# Patient Record
Sex: Female | Born: 1950 | Race: Black or African American | Hispanic: No | Smoking: Current every day smoker
Health system: Southern US, Community
[De-identification: ages and names within clinical notes are randomized; demographics above are authoritative.]

## PROBLEM LIST (undated history)

## (undated) DIAGNOSIS — E079 Disorder of thyroid, unspecified: Secondary | ICD-10-CM

## (undated) DIAGNOSIS — Z5189 Encounter for other specified aftercare: Secondary | ICD-10-CM

## (undated) DIAGNOSIS — C801 Malignant (primary) neoplasm, unspecified: Secondary | ICD-10-CM

## (undated) DIAGNOSIS — R569 Unspecified convulsions: Secondary | ICD-10-CM

## (undated) DIAGNOSIS — N189 Chronic kidney disease, unspecified: Secondary | ICD-10-CM

## (undated) DIAGNOSIS — H409 Unspecified glaucoma: Secondary | ICD-10-CM

## (undated) DIAGNOSIS — J449 Chronic obstructive pulmonary disease, unspecified: Secondary | ICD-10-CM

## (undated) DIAGNOSIS — I10 Essential (primary) hypertension: Secondary | ICD-10-CM

## (undated) DIAGNOSIS — F200 Paranoid schizophrenia: Secondary | ICD-10-CM

## (undated) DIAGNOSIS — F419 Anxiety disorder, unspecified: Secondary | ICD-10-CM

## (undated) DIAGNOSIS — F329 Major depressive disorder, single episode, unspecified: Secondary | ICD-10-CM

## (undated) DIAGNOSIS — I509 Heart failure, unspecified: Secondary | ICD-10-CM

## (undated) DIAGNOSIS — I639 Cerebral infarction, unspecified: Secondary | ICD-10-CM

## (undated) DIAGNOSIS — M1990 Unspecified osteoarthritis, unspecified site: Secondary | ICD-10-CM

## (undated) DIAGNOSIS — J439 Emphysema, unspecified: Secondary | ICD-10-CM

## (undated) DIAGNOSIS — K219 Gastro-esophageal reflux disease without esophagitis: Secondary | ICD-10-CM

## (undated) DIAGNOSIS — D649 Anemia, unspecified: Secondary | ICD-10-CM

## (undated) DIAGNOSIS — F1910 Other psychoactive substance abuse, uncomplicated: Secondary | ICD-10-CM

## (undated) DIAGNOSIS — F32A Depression, unspecified: Secondary | ICD-10-CM

## (undated) HISTORY — DX: Chronic obstructive pulmonary disease, unspecified: J44.9

## (undated) HISTORY — DX: Cerebral infarction, unspecified: I63.9

## (undated) HISTORY — DX: Paranoid schizophrenia: F20.0

## (undated) HISTORY — DX: Anxiety disorder, unspecified: F41.9

## (undated) HISTORY — DX: Encounter for other specified aftercare: Z51.89

## (undated) HISTORY — DX: Chronic kidney disease, unspecified: N18.9

## (undated) HISTORY — DX: Anemia, unspecified: D64.9

## (undated) HISTORY — DX: Heart failure, unspecified: I50.9

## (undated) HISTORY — DX: Gastro-esophageal reflux disease without esophagitis: K21.9

## (undated) HISTORY — DX: Major depressive disorder, single episode, unspecified: F32.9

## (undated) HISTORY — DX: Unspecified glaucoma: H40.9

## (undated) HISTORY — DX: Depression, unspecified: F32.A

## (undated) HISTORY — PX: BLADDER SURGERY: SHX569

## (undated) HISTORY — DX: Other psychoactive substance abuse, uncomplicated: F19.10

## (undated) HISTORY — DX: Emphysema, unspecified: J43.9

## (undated) HISTORY — DX: Unspecified convulsions: R56.9

## (undated) HISTORY — DX: Disorder of thyroid, unspecified: E07.9

## (undated) HISTORY — DX: Unspecified osteoarthritis, unspecified site: M19.90

## (undated) HISTORY — DX: Essential (primary) hypertension: I10

## (undated) HISTORY — DX: Malignant (primary) neoplasm, unspecified: C80.1

## (undated) HISTORY — PX: ABDOMINAL HYSTERECTOMY: SHX81

---

## 2003-07-07 ENCOUNTER — Emergency Department (HOSPITAL_COMMUNITY): Admission: EM | Admit: 2003-07-07 | Discharge: 2003-07-07 | Payer: Self-pay | Admitting: Emergency Medicine

## 2003-07-09 ENCOUNTER — Emergency Department (HOSPITAL_COMMUNITY): Admission: EM | Admit: 2003-07-09 | Discharge: 2003-07-09 | Payer: Self-pay | Admitting: Emergency Medicine

## 2004-08-17 ENCOUNTER — Emergency Department (HOSPITAL_COMMUNITY): Admission: EM | Admit: 2004-08-17 | Discharge: 2004-08-17 | Payer: Self-pay | Admitting: Emergency Medicine

## 2004-10-02 ENCOUNTER — Ambulatory Visit: Payer: Self-pay

## 2005-01-30 ENCOUNTER — Emergency Department: Payer: Self-pay | Admitting: Emergency Medicine

## 2005-06-26 ENCOUNTER — Ambulatory Visit: Payer: Self-pay | Admitting: Otolaryngology

## 2005-08-02 ENCOUNTER — Ambulatory Visit: Payer: Self-pay

## 2005-09-23 ENCOUNTER — Emergency Department: Payer: Self-pay | Admitting: Internal Medicine

## 2006-01-15 ENCOUNTER — Ambulatory Visit: Payer: Self-pay | Admitting: Cardiovascular Disease

## 2006-11-20 ENCOUNTER — Emergency Department (HOSPITAL_COMMUNITY): Admission: EM | Admit: 2006-11-20 | Discharge: 2006-11-20 | Payer: Self-pay | Admitting: Emergency Medicine

## 2006-11-21 ENCOUNTER — Inpatient Hospital Stay (HOSPITAL_COMMUNITY): Admission: EM | Admit: 2006-11-21 | Discharge: 2006-11-22 | Payer: Self-pay | Admitting: Emergency Medicine

## 2006-11-21 ENCOUNTER — Ambulatory Visit: Payer: Self-pay | Admitting: Internal Medicine

## 2007-02-12 ENCOUNTER — Other Ambulatory Visit: Payer: Self-pay

## 2007-02-12 ENCOUNTER — Emergency Department: Payer: Self-pay

## 2007-05-06 ENCOUNTER — Inpatient Hospital Stay: Payer: Self-pay | Admitting: Internal Medicine

## 2007-05-06 ENCOUNTER — Other Ambulatory Visit: Payer: Self-pay

## 2007-05-10 ENCOUNTER — Emergency Department: Payer: Self-pay | Admitting: Emergency Medicine

## 2007-05-11 ENCOUNTER — Other Ambulatory Visit: Payer: Self-pay

## 2007-06-09 ENCOUNTER — Other Ambulatory Visit: Payer: Self-pay

## 2007-06-09 ENCOUNTER — Emergency Department: Payer: Self-pay | Admitting: Emergency Medicine

## 2007-10-08 ENCOUNTER — Other Ambulatory Visit: Payer: Self-pay

## 2007-10-08 ENCOUNTER — Emergency Department: Payer: Self-pay | Admitting: Internal Medicine

## 2008-04-30 ENCOUNTER — Other Ambulatory Visit: Payer: Self-pay

## 2008-04-30 ENCOUNTER — Inpatient Hospital Stay: Payer: Self-pay | Admitting: Psychiatry

## 2009-09-10 ENCOUNTER — Emergency Department: Payer: Self-pay | Admitting: Emergency Medicine

## 2009-10-10 ENCOUNTER — Emergency Department (HOSPITAL_COMMUNITY): Admission: EM | Admit: 2009-10-10 | Discharge: 2009-10-10 | Payer: Self-pay | Admitting: Emergency Medicine

## 2009-10-21 ENCOUNTER — Inpatient Hospital Stay: Payer: Self-pay | Admitting: Psychiatry

## 2009-11-03 ENCOUNTER — Emergency Department: Payer: Self-pay | Admitting: Emergency Medicine

## 2009-11-25 ENCOUNTER — Emergency Department: Payer: Self-pay | Admitting: Unknown Physician Specialty

## 2009-11-25 ENCOUNTER — Emergency Department: Payer: Self-pay | Admitting: Emergency Medicine

## 2009-12-04 ENCOUNTER — Emergency Department: Payer: Self-pay | Admitting: Emergency Medicine

## 2010-04-24 ENCOUNTER — Emergency Department (HOSPITAL_COMMUNITY): Admission: EM | Admit: 2010-04-24 | Discharge: 2010-04-24 | Payer: Self-pay | Admitting: Emergency Medicine

## 2010-09-12 ENCOUNTER — Inpatient Hospital Stay: Payer: Self-pay | Admitting: Internal Medicine

## 2010-11-10 ENCOUNTER — Ambulatory Visit: Payer: Self-pay | Admitting: Rheumatology

## 2010-12-12 ENCOUNTER — Ambulatory Visit: Payer: Self-pay | Admitting: Rheumatology

## 2010-12-20 ENCOUNTER — Ambulatory Visit: Payer: Self-pay | Admitting: Oncology

## 2010-12-22 LAB — CEA: CEA: 3.9 ng/mL

## 2010-12-22 LAB — CANCER ANTIGEN 27.29: CA 27.29: 24.9 U/mL (ref 0.0–38.6)

## 2010-12-22 LAB — CA 125: CA 125: 8.4 U/mL

## 2011-01-10 ENCOUNTER — Ambulatory Visit: Payer: Self-pay | Admitting: Gastroenterology

## 2011-01-10 ENCOUNTER — Ambulatory Visit: Payer: Self-pay | Admitting: Oncology

## 2011-01-12 LAB — PATHOLOGY REPORT

## 2011-02-26 LAB — POCT I-STAT, CHEM 8
BUN: 22 mg/dL (ref 6–23)
Calcium, Ion: 1.23 mmol/L (ref 1.12–1.32)
Chloride: 107 mEq/L (ref 96–112)
Creatinine, Ser: 0.9 mg/dL (ref 0.4–1.2)
Glucose, Bld: 86 mg/dL (ref 70–99)
HCT: 38 % (ref 36.0–46.0)
Sodium: 141 mEq/L (ref 135–145)
TCO2: 29 mmol/L (ref 0–100)

## 2011-02-26 LAB — CBC
HCT: 34.6 % — ABNORMAL LOW (ref 36.0–46.0)
Hemoglobin: 12.2 g/dL (ref 12.0–15.0)
MCHC: 35.2 g/dL (ref 30.0–36.0)
MCV: 87.5 fL (ref 78.0–100.0)
Platelets: 171 10*3/uL (ref 150–400)
RDW: 17.9 % — ABNORMAL HIGH (ref 11.5–15.5)
WBC: 7.7 10*3/uL (ref 4.0–10.5)

## 2011-02-26 LAB — DIFFERENTIAL
Eosinophils Absolute: 0.1 10*3/uL (ref 0.0–0.7)
Eosinophils Relative: 1 % (ref 0–5)
Monocytes Relative: 5 % (ref 3–12)
Neutrophils Relative %: 68 % (ref 43–77)

## 2011-02-26 LAB — RAPID URINE DRUG SCREEN, HOSP PERFORMED
Benzodiazepines: NOT DETECTED
Opiates: NOT DETECTED
Tetrahydrocannabinol: POSITIVE — AB

## 2011-03-14 LAB — DIFFERENTIAL
Basophils Absolute: 0 10*3/uL (ref 0.0–0.1)
Basophils Relative: 1 % (ref 0–1)
Eosinophils Absolute: 0.1 10*3/uL (ref 0.0–0.7)
Lymphocytes Relative: 30 % (ref 12–46)
Lymphs Abs: 1.7 10*3/uL (ref 0.7–4.0)
Monocytes Absolute: 0.4 10*3/uL (ref 0.1–1.0)
Monocytes Relative: 7 % (ref 3–12)
Neutro Abs: 3.3 10*3/uL (ref 1.7–7.7)

## 2011-03-14 LAB — BASIC METABOLIC PANEL
CO2: 25 mEq/L (ref 19–32)
Calcium: 9.5 mg/dL (ref 8.4–10.5)
Chloride: 103 mEq/L (ref 96–112)
Creatinine, Ser: 0.85 mg/dL (ref 0.4–1.2)
GFR calc non Af Amer: >60 mL/min (ref >60)
Potassium: 3.2 mEq/L — ABNORMAL LOW (ref 3.5–5.1)
Sodium: 137 mEq/L (ref 135–145)

## 2011-03-14 LAB — CBC: RBC: 4.44 MIL/uL (ref 3.87–5.11)

## 2011-03-14 LAB — RAPID URINE DRUG SCREEN, HOSP PERFORMED
Benzodiazepines: NOT DETECTED
Opiates: NOT DETECTED
Tetrahydrocannabinol: POSITIVE — AB

## 2011-03-14 LAB — ETHANOL
Alcohol, Ethyl (B): 25 mg/dL — ABNORMAL HIGH (ref 0–10)
Alcohol, Ethyl (B): <5 mg/dL (ref 0–10)

## 2011-04-27 NOTE — Consult Note (Signed)
[name], [name] NO.:  [id number]   MEDICAL RECORD NO.:  [id number]          PATIENT TYPE:  INP   LOCATION:  5504                         FACILITY:  [facility]   PHYSICIAN:  Antonietta Breach, M.D.  DATE OF BIRTH:  [birth year]   DATE OF CONSULTATION:  11/22/2006  DATE OF DISCHARGE:  11/22/2006                                 CONSULTATION   REQUESTING PHYSICIAN:  Madaline Guthrie, M.D.   REASON FOR CONSULTATION:  Depression, alcohol dependence.   HISTORY OF PRESENT ILLNESS:  [name] is a 60 year old female  admitted to the [facility] on November 21, 2006 due to  complications from alcohol.   The patient has been smoking as much marijuana as she can get a hold of  daily.  She also has been drinking regular alcohol.  She has had  secondary cramps.  She has also had diarrhea; however, when she tries to  quit alcohol, she is not able to quit on her own and the patient  acknowledges she drinks to excess and ends up with impaired judgment  when she uses alcohol.   After being in the hospital a day, she has time to reflect on her  impairment when she is intoxicated and she wants to abstain from  alcohol.  She does report some mild depressed mood and mild decreased  energy.  There are no thoughts of harming herself or others.  There are  delusions or hallucinations.  She is socially appropriate and is  oriented.  Her memory is intact.  Her concentration is within normal  limits.   PAST PSYCHIATRIC HISTORY:  The patient denies any history of psychiatric  hospitalization, suicide attempts, or mania.  She was admitted to an  alcohol residential rehabilitation program 15 years ago.   She maintained 2 years of alcohol abstinence after that; however, she  relapsed about 12 years ago and has not been able to quit since.   The patient states that she was placed on Paxil at one point and she had  the onset of high-mood, pressured speech, increased activity, and  was  taken off of it.   FAMILY PSYCHIATRIC HISTORY:  None known.   SOCIAL HISTORY:  [name] is from the area.  She attended 12 years of  special education school.  She did work in Press [employer].  She is divorced.  She has 3 female children.  The patient lives with a boyfriend.  Occupationally, she is medically retired on disability.  Concerning her  chemical abuse, see above.   GENERAL MEDICAL PROBLEMS:  1. Chronic obstructive pulmonary disease.  2. Diabetes mellitus.  3. Diarrhea.  4. Hypertension.   MEDICATIONS:  The MAR is reviewed.  The patient is on:  1. Thiamin 100 mg daily.  2. Folic acid 1 mg daily.   ALLERGIES:  NO KNOWN DRUG ALLERGIES.   LABORATORY DATA:  White blood cell count 6.4, hemoglobin 12.3, platelet  count 161,000.  Metabolic panel is unremarkable.  Her initial SGOT was  elevated at 48, SGPT 44.  Her calcium is 9.  Her TSH is within normal  limits  at 0.9.  Urine drug screen is unremarkable except for a positive  tetrahydrocannabinol consistent with her history.  The alcohol level  upon presentation was 77.   REVIEW OF SYSTEMS:  CONSTITUTIONAL:  Afebrile.  HEAD:  No trauma.  Eyes:  No visual changes.  Ears:  No hearing impairment.  Nose:  No rhinorrhea.  Mouth/Throat:  No sore throat.  NEURO:  Unremarkable.  PSYCHIATRIC:  As  above.  The patient has a normal level of interests, constructive goals,  and the ability to experience pleasure.  CARDIOVASCULAR:  No chest pain,  palpitations, or edema.  RESPIRATORY:  No coughing or wheezing.  GASTROINTESTINAL:  No nausea or vomiting.  GENITOURINARY:  No dysuria.  SKIN:  Unremarkable.  HEMATOLOGIC/LYMPHATIC:  Unremarkable.  MUSCULOSKELETAL:  No deformities.  ENDOCRINE/METABOLIC:  As above.   PHYSICAL EXAMINATION:  VITAL SIGNS:  Temperature 97.6, pulse 70,  respirations 20, blood pressure 198/105, O2 saturation room air 98%.  MENTAL STATUS EXAM:  [name] is a middle-aged female appearing her  stated age,  well-groomed, sitting up in her hospital chair with good eye  contact.  Her psychomotor tone is within normal limits.  She is socially  appropriate and cooperative.  Her affect is mildly constricted at  baseline, but with a broad appropriate response.  Her mood is within  normal limits.   Her speech involves normal rate and prosody.  Her fund of knowledge and  intelligence are within normal limits.  Thought process is logical,  coherent, goal-directed, no looseness of associations.  Thought content:  No thoughts of harming herself, no thoughts of harming others.  No  delusions, no hallucinations.  She is oriented to all spheres.  Her  memory is intact to immediate, recent remote.  Her insight is partial.  Her judgment is intact.   ASSESSMENT:  AXIS I:  293.83 mood disorder not otherwise specified  (general medical and alchohol abuse factors).  Rule out polysubstance  dependence.  AXIS II:  Deferred.  AXIS III:  See general medical problems.  AXIS IV:  Primary support group.  AXIS V:  55.   [name] is not at risk to harm herself or others.  She agrees to  call emergency services immediately for any thoughts of harming herself,  thoughts of harming others, or distress.   The undersigned provided ego-supportive psychotherapy and education.   RECOMMENDATIONS:  1. [name] declines an inpatient drug rehabilitation program.  She      is stating that she is motivated; however, for outpatient      treatment.  She wants followup with 12-step groups.  2. The case manager and the patient have agreed to her entering the      outpatient [facility] program.      Antonietta Breach, M.D.  Electronically Signed     JW/MEDQ  D:  11/22/2006  T:  11/24/2006  Job:  [id number]   cc:   Madaline Guthrie, M.D.

## 2011-05-26 ENCOUNTER — Inpatient Hospital Stay: Payer: Self-pay | Admitting: Psychiatry

## 2011-12-10 ENCOUNTER — Inpatient Hospital Stay: Payer: Self-pay | Admitting: Psychiatry

## 2011-12-14 LAB — URINALYSIS, COMPLETE
Bacteria: NONE SEEN
Bilirubin,UR: NEGATIVE
Blood: NEGATIVE
Glucose,UR: NEGATIVE mg/dL (ref 0–75)
Hyaline Cast: 3
Leukocyte Esterase: NEGATIVE
RBC,UR: 1 /HPF (ref 0–5)
Squamous Epithelial: 2
WBC UR: 1 /HPF (ref 0–5)

## 2011-12-14 LAB — DRUG SCREEN, URINE
Amphetamines, Ur Screen: NEGATIVE (ref ?–1000)
Barbiturates, Ur Screen: NEGATIVE (ref ?–200)
Cannabinoid 50 Ng, Ur ~~LOC~~: POSITIVE (ref ?–50)
MDMA (Ecstasy)Ur Screen: POSITIVE (ref ?–500)
Methadone, Ur Screen: NEGATIVE (ref ?–300)
Tricyclic, Ur Screen: NEGATIVE (ref ?–1000)

## 2012-12-12 ENCOUNTER — Ambulatory Visit: Payer: Self-pay | Admitting: Internal Medicine

## 2013-01-19 ENCOUNTER — Ambulatory Visit: Payer: Self-pay | Admitting: Emergency Medicine

## 2013-01-19 LAB — CBC WITH DIFFERENTIAL/PLATELET
Basophil #: 0.1 10*3/uL (ref 0.0–0.1)
Basophil %: 0.9 %
HCT: 36.3 % (ref 35.0–47.0)
HGB: 12.4 g/dL (ref 12.0–16.0)
Lymphocyte %: 13.9 %
MCH: 31.7 pg (ref 26.0–34.0)
MCHC: 34.3 g/dL (ref 32.0–36.0)
MCV: 92 fL (ref 80–100)
Monocyte #: 0.5 x10 3/mm (ref 0.2–0.9)
Neutrophil #: 4.9 10*3/uL (ref 1.4–6.5)
Neutrophil %: 71.9 %
Platelet: 154 10*3/uL (ref 150–440)
RDW: 14.7 % — ABNORMAL HIGH (ref 11.5–14.5)

## 2013-01-19 LAB — BASIC METABOLIC PANEL
Calcium, Total: 9.5 mg/dL (ref 8.5–10.1)
Chloride: 103 mmol/L (ref 98–107)
Co2: 28 mmol/L (ref 21–32)
Creatinine: 1.02 mg/dL (ref 0.60–1.30)
EGFR (African American): >60
EGFR (Non-African Amer.): 59 — ABNORMAL LOW
Osmolality: 278 (ref 275–301)

## 2013-01-19 LAB — HEPATIC FUNCTION PANEL A (ARMC)
Alkaline Phosphatase: 132 U/L (ref 50–136)
Bilirubin,Total: 0.4 mg/dL (ref 0.2–1.0)
SGPT (ALT): 27 U/L (ref 12–78)

## 2013-01-19 LAB — PROTIME-INR: Prothrombin Time: 12.7 secs (ref 11.5–14.7)

## 2013-01-27 ENCOUNTER — Observation Stay: Payer: Self-pay | Admitting: Internal Medicine

## 2013-01-27 LAB — COMPREHENSIVE METABOLIC PANEL
Albumin: 3.8 g/dL (ref 3.4–5.0)
Anion Gap: 9 (ref 7–16)
Bilirubin,Total: 0.4 mg/dL (ref 0.2–1.0)
Creatinine: 1.25 mg/dL (ref 0.60–1.30)
EGFR (African American): 54 — ABNORMAL LOW
EGFR (Non-African Amer.): 46 — ABNORMAL LOW
Osmolality: 284 (ref 275–301)
SGOT(AST): 31 U/L (ref 15–37)

## 2013-01-27 LAB — TROPONIN I: Troponin-I: <0.02 ng/mL

## 2013-01-27 LAB — CBC
MCH: 30.7 pg (ref 26.0–34.0)
MCHC: 32.9 g/dL (ref 32.0–36.0)
MCV: 93 fL (ref 80–100)
Platelet: 188 10*3/uL (ref 150–440)
WBC: 7.3 10*3/uL (ref 3.6–11.0)

## 2013-01-27 LAB — DRUG SCREEN, URINE
Amphetamines, Ur Screen: NEGATIVE (ref ?–1000)
Barbiturates, Ur Screen: NEGATIVE (ref ?–200)
Cannabinoid 50 Ng, Ur ~~LOC~~: POSITIVE (ref ?–50)
Methadone, Ur Screen: NEGATIVE (ref ?–300)
Opiate, Ur Screen: NEGATIVE (ref ?–300)
Phencyclidine (PCP) Ur S: NEGATIVE (ref ?–25)

## 2013-01-27 LAB — URINALYSIS, COMPLETE
Glucose,UR: NEGATIVE mg/dL (ref 0–75)
Ketone: NEGATIVE
Specific Gravity: 1.02 (ref 1.003–1.030)
Squamous Epithelial: 3
WBC UR: 1 /HPF (ref 0–5)

## 2013-01-27 LAB — ETHANOL: Ethanol %: <0.003 % (ref 0.000–0.080)

## 2013-01-27 LAB — CK TOTAL AND CKMB (NOT AT ARMC)
CK, Total: 164 U/L (ref 21–215)
CK-MB: 1.5 ng/mL (ref 0.5–3.6)

## 2013-01-27 LAB — PRO B NATRIURETIC PEPTIDE: B-Type Natriuretic Peptide: 75 pg/mL (ref 0–125)

## 2013-01-28 LAB — CBC WITH DIFFERENTIAL/PLATELET
Basophil #: 0 10*3/uL (ref 0.0–0.1)
Basophil %: 0.5 %
HCT: 35.3 % (ref 35.0–47.0)
HGB: 11.7 g/dL — ABNORMAL LOW (ref 12.0–16.0)
Lymphocyte %: 8.3 %
MCH: 31 pg (ref 26.0–34.0)
MCHC: 33.2 g/dL (ref 32.0–36.0)
MCV: 94 fL (ref 80–100)
Monocyte #: 0.2 x10 3/mm (ref 0.2–0.9)
Monocyte %: 2.4 %
Neutrophil %: 87.9 %
RBC: 3.77 10*6/uL — ABNORMAL LOW (ref 3.80–5.20)

## 2013-01-28 LAB — BASIC METABOLIC PANEL
Anion Gap: 9 (ref 7–16)
BUN: 16 mg/dL (ref 7–18)
Calcium, Total: 9.3 mg/dL (ref 8.5–10.1)
Chloride: 108 mmol/L — ABNORMAL HIGH (ref 98–107)
Co2: 24 mmol/L (ref 21–32)
Creatinine: 0.99 mg/dL (ref 0.60–1.30)
EGFR (African American): >60
Potassium: 3.6 mmol/L (ref 3.5–5.1)

## 2013-01-29 LAB — HEPATIC FUNCTION PANEL A (ARMC)
Albumin: 3.3 g/dL — ABNORMAL LOW (ref 3.4–5.0)
Alkaline Phosphatase: 140 U/L — ABNORMAL HIGH (ref 50–136)
Bilirubin, Direct: <0.1 mg/dL (ref 0.00–0.20)

## 2013-01-30 LAB — BASIC METABOLIC PANEL
Anion Gap: 5 — ABNORMAL LOW (ref 7–16)
Calcium, Total: 9.5 mg/dL (ref 8.5–10.1)
Chloride: 105 mmol/L (ref 98–107)
Creatinine: 0.94 mg/dL (ref 0.60–1.30)
Osmolality: 281 (ref 275–301)
Potassium: 3.4 mmol/L — ABNORMAL LOW (ref 3.5–5.1)

## 2013-02-02 LAB — CULTURE, BLOOD (SINGLE)

## 2013-02-03 LAB — PATHOLOGY REPORT

## 2013-04-16 ENCOUNTER — Ambulatory Visit: Payer: Self-pay | Admitting: Internal Medicine

## 2013-05-08 ENCOUNTER — Other Ambulatory Visit: Payer: Self-pay | Admitting: Neurology

## 2014-01-14 ENCOUNTER — Ambulatory Visit: Payer: Self-pay | Admitting: Cardiothoracic Surgery

## 2014-02-18 ENCOUNTER — Ambulatory Visit: Payer: Self-pay | Admitting: Cardiothoracic Surgery

## 2014-03-09 ENCOUNTER — Ambulatory Visit: Payer: Self-pay

## 2014-03-09 DIAGNOSIS — I10 Essential (primary) hypertension: Secondary | ICD-10-CM

## 2014-03-09 DIAGNOSIS — E119 Type 2 diabetes mellitus without complications: Secondary | ICD-10-CM

## 2014-03-09 LAB — CBC WITH DIFFERENTIAL/PLATELET
BASOS ABS: 0.1 10*3/uL (ref 0.0–0.1)
Basophil %: 1 %
EOS PCT: 5.7 %
Eosinophil #: 0.3 10*3/uL (ref 0.0–0.7)
HCT: 30.8 % — AB (ref 35.0–47.0)
HGB: 9.9 g/dL — AB (ref 12.0–16.0)
Lymphocyte #: 1.1 10*3/uL (ref 1.0–3.6)
Lymphocyte %: 19.5 %
MCH: 28.9 pg (ref 26.0–34.0)
MCHC: 32.1 g/dL (ref 32.0–36.0)
MCV: 90 fL (ref 80–100)
MONOS PCT: 11.1 %
Monocyte #: 0.6 x10 3/mm (ref 0.2–0.9)
Neutrophil #: 3.4 10*3/uL (ref 1.4–6.5)
Neutrophil %: 62.7 %
Platelet: 182 10*3/uL (ref 150–440)
RBC: 3.42 10*6/uL — ABNORMAL LOW (ref 3.80–5.20)
RDW: 14.8 % — AB (ref 11.5–14.5)
WBC: 5.4 10*3/uL (ref 3.6–11.0)

## 2014-03-09 LAB — APTT: ACTIVATED PTT: 34.7 s (ref 23.6–35.9)

## 2014-03-09 LAB — COMPREHENSIVE METABOLIC PANEL
Albumin: 3.4 g/dL (ref 3.4–5.0)
Alkaline Phosphatase: 140 U/L — ABNORMAL HIGH
Anion Gap: 4 — ABNORMAL LOW (ref 7–16)
BUN: 17 mg/dL (ref 7–18)
Bilirubin,Total: 0.2 mg/dL (ref 0.2–1.0)
CHLORIDE: 108 mmol/L — AB (ref 98–107)
Calcium, Total: 9.1 mg/dL (ref 8.5–10.1)
Co2: 28 mmol/L (ref 21–32)
Creatinine: 1.61 mg/dL — ABNORMAL HIGH (ref 0.60–1.30)
EGFR (African American): 39 — ABNORMAL LOW
EGFR (Non-African Amer.): 34 — ABNORMAL LOW
GLUCOSE: 98 mg/dL (ref 65–99)
Osmolality: 281 (ref 275–301)
Potassium: 3.4 mmol/L — ABNORMAL LOW (ref 3.5–5.1)
SGOT(AST): 21 U/L (ref 15–37)
SGPT (ALT): 13 U/L (ref 12–78)
Sodium: 140 mmol/L (ref 136–145)
Total Protein: 7.6 g/dL (ref 6.4–8.2)

## 2014-03-09 LAB — PROTIME-INR
INR: 1
Prothrombin Time: 13 secs (ref 11.5–14.7)

## 2014-03-10 ENCOUNTER — Ambulatory Visit: Payer: Self-pay | Admitting: Cardiothoracic Surgery

## 2014-03-15 ENCOUNTER — Ambulatory Visit: Payer: Self-pay

## 2014-03-19 LAB — PATHOLOGY REPORT

## 2014-04-05 LAB — CBC CANCER CENTER
BASOS ABS: 0.1 x10 3/mm (ref 0.0–0.1)
Basophil %: 1.4 %
EOS PCT: 5.6 %
Eosinophil #: 0.3 x10 3/mm (ref 0.0–0.7)
HCT: 34.3 % — AB (ref 35.0–47.0)
HGB: 11.2 g/dL — ABNORMAL LOW (ref 12.0–16.0)
Lymphocyte #: 0.8 x10 3/mm — ABNORMAL LOW (ref 1.0–3.6)
Lymphocyte %: 16.5 %
MCH: 28.5 pg (ref 26.0–34.0)
MCHC: 32.6 g/dL (ref 32.0–36.0)
MCV: 88 fL (ref 80–100)
MONO ABS: 0.5 x10 3/mm (ref 0.2–0.9)
Monocyte %: 10.6 %
NEUTROS ABS: 3.1 x10 3/mm (ref 1.4–6.5)
Neutrophil %: 65.9 %
PLATELETS: 191 x10 3/mm (ref 150–440)
RBC: 3.92 10*6/uL (ref 3.80–5.20)
RDW: 15.7 % — ABNORMAL HIGH (ref 11.5–14.5)
WBC: 4.6 x10 3/mm (ref 3.6–11.0)

## 2014-04-05 LAB — COMPREHENSIVE METABOLIC PANEL
ALK PHOS: 240 U/L — AB
ANION GAP: 12 (ref 7–16)
AST: 44 U/L — AB (ref 15–37)
Albumin: 3.7 g/dL (ref 3.4–5.0)
BILIRUBIN TOTAL: 0.4 mg/dL (ref 0.2–1.0)
BUN: 16 mg/dL (ref 7–18)
CALCIUM: 10.1 mg/dL (ref 8.5–10.1)
Chloride: 102 mmol/L (ref 98–107)
Co2: 29 mmol/L (ref 21–32)
Creatinine: 1.34 mg/dL — ABNORMAL HIGH (ref 0.60–1.30)
EGFR (Non-African Amer.): 42 — ABNORMAL LOW
GFR CALC AF AMER: 49 — AB
GLUCOSE: 111 mg/dL — AB (ref 65–99)
Osmolality: 287 (ref 275–301)
POTASSIUM: 3.1 mmol/L — AB (ref 3.5–5.1)
SGPT (ALT): 42 U/L (ref 12–78)
Sodium: 143 mmol/L (ref 136–145)
Total Protein: 8 g/dL (ref 6.4–8.2)

## 2014-04-09 ENCOUNTER — Ambulatory Visit: Payer: Self-pay | Admitting: Cardiothoracic Surgery

## 2014-04-12 LAB — CBC CANCER CENTER
Basophil #: 0 x10 3/mm (ref 0.0–0.1)
Basophil %: 1.2 %
Eosinophil #: 0.1 x10 3/mm (ref 0.0–0.7)
Eosinophil %: 2.2 %
HCT: 30.3 % — AB (ref 35.0–47.0)
HGB: 10.2 g/dL — ABNORMAL LOW (ref 12.0–16.0)
LYMPHS ABS: 0.7 x10 3/mm — AB (ref 1.0–3.6)
Lymphocyte %: 16.7 %
MCH: 29 pg (ref 26.0–34.0)
MCHC: 33.6 g/dL (ref 32.0–36.0)
MCV: 86 fL (ref 80–100)
MONO ABS: 0.4 x10 3/mm (ref 0.2–0.9)
Monocyte %: 10 %
NEUTROS PCT: 69.9 %
Neutrophil #: 2.8 x10 3/mm (ref 1.4–6.5)
Platelet: 221 x10 3/mm (ref 150–440)
RBC: 3.51 10*6/uL — AB (ref 3.80–5.20)
RDW: 16 % — AB (ref 11.5–14.5)
WBC: 4 x10 3/mm (ref 3.6–11.0)

## 2014-04-12 LAB — BASIC METABOLIC PANEL
ANION GAP: 6 — AB (ref 7–16)
BUN: 30 mg/dL — AB (ref 7–18)
CO2: 30 mmol/L (ref 21–32)
CREATININE: 1.35 mg/dL — AB (ref 0.60–1.30)
Calcium, Total: 8.6 mg/dL (ref 8.5–10.1)
Chloride: 106 mmol/L (ref 98–107)
EGFR (Non-African Amer.): 42 — ABNORMAL LOW
GFR CALC AF AMER: 48 — AB
GLUCOSE: 103 mg/dL — AB (ref 65–99)
Osmolality: 290 (ref 275–301)
POTASSIUM: 3.7 mmol/L (ref 3.5–5.1)
Sodium: 142 mmol/L (ref 136–145)

## 2014-04-19 LAB — CBC CANCER CENTER
BASOS PCT: 2 %
Basophil #: 0.1 x10 3/mm (ref 0.0–0.1)
EOS PCT: 4.6 %
Eosinophil #: 0.2 x10 3/mm (ref 0.0–0.7)
HCT: 30.3 % — AB (ref 35.0–47.0)
HGB: 10 g/dL — ABNORMAL LOW (ref 12.0–16.0)
Lymphocyte #: 0.7 x10 3/mm — ABNORMAL LOW (ref 1.0–3.6)
Lymphocyte %: 20.6 %
MCH: 28.9 pg (ref 26.0–34.0)
MCHC: 33 g/dL (ref 32.0–36.0)
MCV: 88 fL (ref 80–100)
Monocyte #: 0.5 x10 3/mm (ref 0.2–0.9)
Monocyte %: 14.1 %
NEUTROS ABS: 2 x10 3/mm (ref 1.4–6.5)
NEUTROS PCT: 58.7 %
PLATELETS: 211 x10 3/mm (ref 150–440)
RBC: 3.46 10*6/uL — ABNORMAL LOW (ref 3.80–5.20)
RDW: 16.1 % — AB (ref 11.5–14.5)
WBC: 3.5 x10 3/mm — ABNORMAL LOW (ref 3.6–11.0)

## 2014-04-19 LAB — COMPREHENSIVE METABOLIC PANEL
ALK PHOS: 126 U/L — AB
AST: 16 U/L (ref 15–37)
Albumin: 3.4 g/dL (ref 3.4–5.0)
Anion Gap: 7 (ref 7–16)
BILIRUBIN TOTAL: 0.4 mg/dL (ref 0.2–1.0)
BUN: 9 mg/dL (ref 7–18)
CALCIUM: 9 mg/dL (ref 8.5–10.1)
CHLORIDE: 105 mmol/L (ref 98–107)
CREATININE: 1.16 mg/dL (ref 0.60–1.30)
Co2: 29 mmol/L (ref 21–32)
EGFR (African American): 58 — ABNORMAL LOW
GFR CALC NON AF AMER: 50 — AB
Glucose: 110 mg/dL — ABNORMAL HIGH (ref 65–99)
Osmolality: 281 (ref 275–301)
Potassium: 3.3 mmol/L — ABNORMAL LOW (ref 3.5–5.1)
SGPT (ALT): 23 U/L (ref 12–78)
Sodium: 141 mmol/L (ref 136–145)
Total Protein: 7.4 g/dL (ref 6.4–8.2)

## 2014-04-26 LAB — CBC CANCER CENTER
BASOS PCT: 1 %
Basophil #: 0.1 x10 3/mm (ref 0.0–0.1)
Eosinophil #: 0 x10 3/mm (ref 0.0–0.7)
Eosinophil %: 0.9 %
HCT: 30.9 % — ABNORMAL LOW (ref 35.0–47.0)
HGB: 10.3 g/dL — ABNORMAL LOW (ref 12.0–16.0)
LYMPHS ABS: 0.9 x10 3/mm — AB (ref 1.0–3.6)
Lymphocyte %: 16.6 %
MCH: 28.5 pg (ref 26.0–34.0)
MCHC: 33.4 g/dL (ref 32.0–36.0)
MCV: 85 fL (ref 80–100)
Monocyte #: 0.5 x10 3/mm (ref 0.2–0.9)
Monocyte %: 9.2 %
Neutrophil #: 3.9 x10 3/mm (ref 1.4–6.5)
Neutrophil %: 72.3 %
Platelet: 264 x10 3/mm (ref 150–440)
RBC: 3.62 10*6/uL — ABNORMAL LOW (ref 3.80–5.20)
RDW: 16.6 % — AB (ref 11.5–14.5)
WBC: 5.4 x10 3/mm (ref 3.6–11.0)

## 2014-05-04 LAB — CBC CANCER CENTER
BASOS ABS: 0.1 x10 3/mm (ref 0.0–0.1)
Basophil %: 1.8 %
EOS PCT: 1.9 %
Eosinophil #: 0.1 x10 3/mm (ref 0.0–0.7)
HCT: 28.8 % — ABNORMAL LOW (ref 35.0–47.0)
HGB: 9.5 g/dL — ABNORMAL LOW (ref 12.0–16.0)
Lymphocyte #: 0.9 x10 3/mm — ABNORMAL LOW (ref 1.0–3.6)
Lymphocyte %: 22.7 %
MCH: 29 pg (ref 26.0–34.0)
MCHC: 33 g/dL (ref 32.0–36.0)
MCV: 88 fL (ref 80–100)
MONOS PCT: 19.5 %
Monocyte #: 0.8 x10 3/mm (ref 0.2–0.9)
NEUTROS ABS: 2.2 x10 3/mm (ref 1.4–6.5)
NEUTROS PCT: 54.1 %
Platelet: 176 x10 3/mm (ref 150–440)
RBC: 3.27 10*6/uL — ABNORMAL LOW (ref 3.80–5.20)
RDW: 16.9 % — ABNORMAL HIGH (ref 11.5–14.5)
WBC: 4.2 x10 3/mm (ref 3.6–11.0)

## 2014-05-04 LAB — COMPREHENSIVE METABOLIC PANEL
Albumin: 3.5 g/dL (ref 3.4–5.0)
Alkaline Phosphatase: 176 U/L — ABNORMAL HIGH
Anion Gap: 8 (ref 7–16)
BUN: 20 mg/dL — ABNORMAL HIGH (ref 7–18)
Bilirubin,Total: 0.5 mg/dL (ref 0.2–1.0)
Calcium, Total: 8.7 mg/dL (ref 8.5–10.1)
Chloride: 104 mmol/L (ref 98–107)
Co2: 27 mmol/L (ref 21–32)
Creatinine: 1.16 mg/dL (ref 0.60–1.30)
EGFR (African American): 58 — ABNORMAL LOW
EGFR (Non-African Amer.): 50 — ABNORMAL LOW
Glucose: 96 mg/dL (ref 65–99)
Osmolality: 280 (ref 275–301)
Potassium: 3.7 mmol/L (ref 3.5–5.1)
SGOT(AST): 28 U/L (ref 15–37)
SGPT (ALT): 48 U/L (ref 12–78)
Sodium: 139 mmol/L (ref 136–145)
Total Protein: 6.9 g/dL (ref 6.4–8.2)

## 2014-05-10 ENCOUNTER — Ambulatory Visit: Payer: Self-pay | Admitting: Cardiothoracic Surgery

## 2014-05-10 LAB — CBC CANCER CENTER
Basophil #: 0.1 x10 3/mm (ref 0.0–0.1)
Basophil %: 0.9 %
EOS PCT: 1.9 %
Eosinophil #: 0.1 x10 3/mm (ref 0.0–0.7)
HCT: 30.1 % — AB (ref 35.0–47.0)
HGB: 9.8 g/dL — ABNORMAL LOW (ref 12.0–16.0)
LYMPHS PCT: 11.9 %
Lymphocyte #: 0.7 x10 3/mm — ABNORMAL LOW (ref 1.0–3.6)
MCH: 28.5 pg (ref 26.0–34.0)
MCHC: 32.5 g/dL (ref 32.0–36.0)
MCV: 88 fL (ref 80–100)
MONO ABS: 0.5 x10 3/mm (ref 0.2–0.9)
Monocyte %: 9.1 %
Neutrophil #: 4.4 x10 3/mm (ref 1.4–6.5)
Neutrophil %: 76.2 %
PLATELETS: 198 x10 3/mm (ref 150–440)
RBC: 3.44 10*6/uL — ABNORMAL LOW (ref 3.80–5.20)
RDW: 16.7 % — AB (ref 11.5–14.5)
WBC: 5.8 x10 3/mm (ref 3.6–11.0)

## 2014-05-17 LAB — COMPREHENSIVE METABOLIC PANEL
ALT: 30 U/L (ref 12–78)
ANION GAP: 11 (ref 7–16)
Albumin: 3.2 g/dL — ABNORMAL LOW (ref 3.4–5.0)
Alkaline Phosphatase: 135 U/L — ABNORMAL HIGH
BILIRUBIN TOTAL: 0.4 mg/dL (ref 0.2–1.0)
BUN: 17 mg/dL (ref 7–18)
CALCIUM: 9.5 mg/dL (ref 8.5–10.1)
CO2: 24 mmol/L (ref 21–32)
Chloride: 104 mmol/L (ref 98–107)
Creatinine: 1.24 mg/dL (ref 0.60–1.30)
EGFR (African American): 54 — ABNORMAL LOW
GFR CALC NON AF AMER: 46 — AB
Glucose: 92 mg/dL (ref 65–99)
Osmolality: 279 (ref 275–301)
Potassium: 3.8 mmol/L (ref 3.5–5.1)
SGOT(AST): 26 U/L (ref 15–37)
Sodium: 139 mmol/L (ref 136–145)
Total Protein: 7.2 g/dL (ref 6.4–8.2)

## 2014-05-17 LAB — CBC CANCER CENTER
Basophil #: 0 x10 3/mm (ref 0.0–0.1)
Basophil %: 0.8 %
Eosinophil #: 0.1 x10 3/mm (ref 0.0–0.7)
Eosinophil %: 2.7 %
HCT: 25.6 % — ABNORMAL LOW (ref 35.0–47.0)
HGB: 8.5 g/dL — ABNORMAL LOW (ref 12.0–16.0)
LYMPHS ABS: 0.8 x10 3/mm — AB (ref 1.0–3.6)
LYMPHS PCT: 17.3 %
MCH: 28.9 pg (ref 26.0–34.0)
MCHC: 33.2 g/dL (ref 32.0–36.0)
MCV: 87 fL (ref 80–100)
Monocyte #: 1 x10 3/mm — ABNORMAL HIGH (ref 0.2–0.9)
Monocyte %: 21.1 %
Neutrophil #: 2.7 x10 3/mm (ref 1.4–6.5)
Neutrophil %: 58.1 %
Platelet: 208 x10 3/mm (ref 150–440)
RBC: 2.93 10*6/uL — AB (ref 3.80–5.20)
RDW: 17.4 % — ABNORMAL HIGH (ref 11.5–14.5)
WBC: 4.7 x10 3/mm (ref 3.6–11.0)

## 2014-06-07 LAB — COMPREHENSIVE METABOLIC PANEL
ALT: 16 U/L (ref 12–78)
ANION GAP: 4 — AB (ref 7–16)
AST: 20 U/L (ref 15–37)
Albumin: 3.1 g/dL — ABNORMAL LOW (ref 3.4–5.0)
Alkaline Phosphatase: 109 U/L
BILIRUBIN TOTAL: 0.2 mg/dL (ref 0.2–1.0)
BUN: 11 mg/dL (ref 7–18)
Calcium, Total: 9.2 mg/dL (ref 8.5–10.1)
Chloride: 109 mmol/L — ABNORMAL HIGH (ref 98–107)
Co2: 29 mmol/L (ref 21–32)
Creatinine: 1.34 mg/dL — ABNORMAL HIGH (ref 0.60–1.30)
GFR CALC AF AMER: 49 — AB
GFR CALC NON AF AMER: 42 — AB
Glucose: 96 mg/dL (ref 65–99)
Osmolality: 282 (ref 275–301)
POTASSIUM: 3.6 mmol/L (ref 3.5–5.1)
SODIUM: 142 mmol/L (ref 136–145)
TOTAL PROTEIN: 6.8 g/dL (ref 6.4–8.2)

## 2014-06-07 LAB — CBC CANCER CENTER
BASOS PCT: 0.9 %
Basophil #: 0 x10 3/mm (ref 0.0–0.1)
EOS ABS: 0.1 x10 3/mm (ref 0.0–0.7)
Eosinophil %: 2 %
HCT: 26.3 % — ABNORMAL LOW (ref 35.0–47.0)
HGB: 8.6 g/dL — ABNORMAL LOW (ref 12.0–16.0)
LYMPHS PCT: 23.8 %
Lymphocyte #: 1.1 x10 3/mm (ref 1.0–3.6)
MCH: 28.4 pg (ref 26.0–34.0)
MCHC: 32.8 g/dL (ref 32.0–36.0)
MCV: 87 fL (ref 80–100)
Monocyte #: 0.4 x10 3/mm (ref 0.2–0.9)
Monocyte %: 9.2 %
NEUTROS ABS: 2.9 x10 3/mm (ref 1.4–6.5)
Neutrophil %: 64.1 %
Platelet: 219 x10 3/mm (ref 150–440)
RBC: 3.04 10*6/uL — ABNORMAL LOW (ref 3.80–5.20)
RDW: 19.9 % — ABNORMAL HIGH (ref 11.5–14.5)
WBC: 4.5 x10 3/mm (ref 3.6–11.0)

## 2014-06-09 ENCOUNTER — Ambulatory Visit: Payer: Self-pay | Admitting: Cardiothoracic Surgery

## 2014-06-21 LAB — COMPREHENSIVE METABOLIC PANEL
ALT: 20 U/L (ref 12–78)
Albumin: 3.6 g/dL (ref 3.4–5.0)
Alkaline Phosphatase: 105 U/L
Anion Gap: 12 (ref 7–16)
BILIRUBIN TOTAL: 0.2 mg/dL (ref 0.2–1.0)
BUN: 20 mg/dL — AB (ref 7–18)
CHLORIDE: 107 mmol/L (ref 98–107)
CREATININE: 1.36 mg/dL — AB (ref 0.60–1.30)
Calcium, Total: 8.8 mg/dL (ref 8.5–10.1)
Co2: 26 mmol/L (ref 21–32)
EGFR (African American): 48 — ABNORMAL LOW
EGFR (Non-African Amer.): 41 — ABNORMAL LOW
Glucose: 69 mg/dL (ref 65–99)
Osmolality: 290 (ref 275–301)
POTASSIUM: 3.4 mmol/L — AB (ref 3.5–5.1)
SGOT(AST): 16 U/L (ref 15–37)
Sodium: 145 mmol/L (ref 136–145)
Total Protein: 6.9 g/dL (ref 6.4–8.2)

## 2014-06-21 LAB — CBC CANCER CENTER
Basophil #: 0.1 x10 3/mm (ref 0.0–0.1)
Basophil %: 2.7 %
Eosinophil #: 0.1 x10 3/mm (ref 0.0–0.7)
Eosinophil %: 4 %
HCT: 29.6 % — AB (ref 35.0–47.0)
HGB: 9.8 g/dL — AB (ref 12.0–16.0)
LYMPHS ABS: 0.6 x10 3/mm — AB (ref 1.0–3.6)
Lymphocyte %: 32.5 %
MCH: 29.4 pg (ref 26.0–34.0)
MCHC: 33.1 g/dL (ref 32.0–36.0)
MCV: 89 fL (ref 80–100)
Monocyte #: 0.5 x10 3/mm (ref 0.2–0.9)
Monocyte %: 24.7 %
NEUTROS ABS: 0.7 x10 3/mm — AB (ref 1.4–6.5)
Neutrophil %: 36.1 %
Platelet: 170 x10 3/mm (ref 150–440)
RBC: 3.32 10*6/uL — ABNORMAL LOW (ref 3.80–5.20)
RDW: 24.1 % — ABNORMAL HIGH (ref 11.5–14.5)
WBC: 2 x10 3/mm — AB (ref 3.6–11.0)

## 2014-06-28 LAB — CBC CANCER CENTER
BASOS ABS: 0.1 x10 3/mm (ref 0.0–0.1)
BASOS PCT: 0.9 %
Eosinophil #: 0 x10 3/mm (ref 0.0–0.7)
Eosinophil %: 0.6 %
HCT: 32.8 % — AB (ref 35.0–47.0)
HGB: 10.5 g/dL — ABNORMAL LOW (ref 12.0–16.0)
LYMPHS PCT: 14.7 %
Lymphocyte #: 0.9 x10 3/mm — ABNORMAL LOW (ref 1.0–3.6)
MCH: 29.3 pg (ref 26.0–34.0)
MCHC: 32.1 g/dL (ref 32.0–36.0)
MCV: 91 fL (ref 80–100)
Monocyte #: 0.6 x10 3/mm (ref 0.2–0.9)
Monocyte %: 10.9 %
NEUTROS PCT: 72.9 %
Neutrophil #: 4.3 x10 3/mm (ref 1.4–6.5)
Platelet: 168 x10 3/mm (ref 150–440)
RBC: 3.59 10*6/uL — AB (ref 3.80–5.20)
RDW: 24 % — AB (ref 11.5–14.5)
WBC: 5.9 x10 3/mm (ref 3.6–11.0)

## 2014-06-28 LAB — COMPREHENSIVE METABOLIC PANEL
ALBUMIN: 3.5 g/dL (ref 3.4–5.0)
ALT: 21 U/L (ref 12–78)
ANION GAP: 10 (ref 7–16)
Alkaline Phosphatase: 95 U/L
BILIRUBIN TOTAL: 0.2 mg/dL (ref 0.2–1.0)
BUN: 26 mg/dL — ABNORMAL HIGH (ref 7–18)
Calcium, Total: 9.2 mg/dL (ref 8.5–10.1)
Chloride: 105 mmol/L (ref 98–107)
Co2: 29 mmol/L (ref 21–32)
Creatinine: 1.21 mg/dL (ref 0.60–1.30)
EGFR (Non-African Amer.): 48 — ABNORMAL LOW
GFR CALC AF AMER: 55 — AB
Glucose: 85 mg/dL (ref 65–99)
OSMOLALITY: 291 (ref 275–301)
Potassium: 2.9 mmol/L — ABNORMAL LOW (ref 3.5–5.1)
SGOT(AST): 14 U/L — ABNORMAL LOW (ref 15–37)
Sodium: 144 mmol/L (ref 136–145)
TOTAL PROTEIN: 6.6 g/dL (ref 6.4–8.2)

## 2014-07-10 ENCOUNTER — Ambulatory Visit: Payer: Self-pay | Admitting: Cardiothoracic Surgery

## 2014-07-12 LAB — COMPREHENSIVE METABOLIC PANEL
ALK PHOS: 114 U/L
Albumin: 3.6 g/dL (ref 3.4–5.0)
Anion Gap: 10 (ref 7–16)
BUN: 17 mg/dL (ref 7–18)
Bilirubin,Total: 0.3 mg/dL (ref 0.2–1.0)
CHLORIDE: 105 mmol/L (ref 98–107)
CREATININE: 1.2 mg/dL (ref 0.60–1.30)
Calcium, Total: 9.3 mg/dL (ref 8.5–10.1)
Co2: 27 mmol/L (ref 21–32)
EGFR (Non-African Amer.): 48 — ABNORMAL LOW
GFR CALC AF AMER: 56 — AB
GLUCOSE: 110 mg/dL — AB (ref 65–99)
Osmolality: 285 (ref 275–301)
Potassium: 3 mmol/L — ABNORMAL LOW (ref 3.5–5.1)
SGOT(AST): 19 U/L (ref 15–37)
SGPT (ALT): 23 U/L
SODIUM: 142 mmol/L (ref 136–145)
Total Protein: 7.1 g/dL (ref 6.4–8.2)

## 2014-07-12 LAB — CBC CANCER CENTER
BASOS ABS: 0 x10 3/mm (ref 0.0–0.1)
Basophil %: 0.7 %
EOS PCT: 4.5 %
Eosinophil #: 0.1 x10 3/mm (ref 0.0–0.7)
HCT: 33.9 % — ABNORMAL LOW (ref 35.0–47.0)
HGB: 11 g/dL — ABNORMAL LOW (ref 12.0–16.0)
Lymphocyte #: 0.7 x10 3/mm — ABNORMAL LOW (ref 1.0–3.6)
Lymphocyte %: 26.3 %
MCH: 29.6 pg (ref 26.0–34.0)
MCHC: 32.3 g/dL (ref 32.0–36.0)
MCV: 92 fL (ref 80–100)
MONO ABS: 0.5 x10 3/mm (ref 0.2–0.9)
MONOS PCT: 19.1 %
NEUTROS PCT: 49.4 %
Neutrophil #: 1.3 x10 3/mm — ABNORMAL LOW (ref 1.4–6.5)
Platelet: 194 x10 3/mm (ref 150–440)
RBC: 3.7 10*6/uL — ABNORMAL LOW (ref 3.80–5.20)
RDW: 21.7 % — ABNORMAL HIGH (ref 11.5–14.5)
WBC: 2.6 x10 3/mm — AB (ref 3.6–11.0)

## 2014-07-26 LAB — CBC CANCER CENTER
Basophil #: 0 x10 3/mm (ref 0.0–0.1)
Basophil %: 0.7 %
Eosinophil #: 0.1 x10 3/mm (ref 0.0–0.7)
Eosinophil %: 2 %
HCT: 30.1 % — ABNORMAL LOW (ref 35.0–47.0)
HGB: 9.7 g/dL — ABNORMAL LOW (ref 12.0–16.0)
Lymphocyte #: 0.6 x10 3/mm — ABNORMAL LOW (ref 1.0–3.6)
Lymphocyte %: 16.5 %
MCH: 29.7 pg (ref 26.0–34.0)
MCHC: 32.3 g/dL (ref 32.0–36.0)
MCV: 92 fL (ref 80–100)
Monocyte #: 0.8 x10 3/mm (ref 0.2–0.9)
Monocyte %: 20.3 %
Neutrophil #: 2.3 x10 3/mm (ref 1.4–6.5)
Neutrophil %: 60.5 %
Platelet: 198 x10 3/mm (ref 150–440)
RBC: 3.28 10*6/uL — ABNORMAL LOW (ref 3.80–5.20)
RDW: 20.7 % — ABNORMAL HIGH (ref 11.5–14.5)
WBC: 3.7 x10 3/mm (ref 3.6–11.0)

## 2014-07-26 LAB — COMPREHENSIVE METABOLIC PANEL
ALT: 22 U/L
ANION GAP: 10 (ref 7–16)
AST: 19 U/L (ref 15–37)
Albumin: 3.4 g/dL (ref 3.4–5.0)
Alkaline Phosphatase: 103 U/L
BUN: 18 mg/dL (ref 7–18)
Bilirubin,Total: 0.2 mg/dL (ref 0.2–1.0)
CHLORIDE: 107 mmol/L (ref 98–107)
CREATININE: 1.28 mg/dL (ref 0.60–1.30)
Calcium, Total: 8.9 mg/dL (ref 8.5–10.1)
Co2: 26 mmol/L (ref 21–32)
EGFR (African American): 52 — ABNORMAL LOW
EGFR (Non-African Amer.): 44 — ABNORMAL LOW
GLUCOSE: 116 mg/dL — AB (ref 65–99)
OSMOLALITY: 288 (ref 275–301)
Potassium: 3.5 mmol/L (ref 3.5–5.1)
SODIUM: 143 mmol/L (ref 136–145)
Total Protein: 6.8 g/dL (ref 6.4–8.2)

## 2014-08-09 LAB — CBC CANCER CENTER
Basophil #: 0.1 x10 3/mm (ref 0.0–0.1)
Basophil %: 1.4 %
EOS PCT: 7.3 %
Eosinophil #: 0.4 x10 3/mm (ref 0.0–0.7)
HCT: 31.3 % — ABNORMAL LOW (ref 35.0–47.0)
HGB: 10.2 g/dL — ABNORMAL LOW (ref 12.0–16.0)
Lymphocyte #: 0.8 x10 3/mm — ABNORMAL LOW (ref 1.0–3.6)
Lymphocyte %: 14.1 %
MCH: 30 pg (ref 26.0–34.0)
MCHC: 32.6 g/dL (ref 32.0–36.0)
MCV: 92 fL (ref 80–100)
Monocyte #: 0.6 x10 3/mm (ref 0.2–0.9)
Monocyte %: 9.8 %
NEUTROS ABS: 4 x10 3/mm (ref 1.4–6.5)
Neutrophil %: 67.4 %
Platelet: 186 x10 3/mm (ref 150–440)
RBC: 3.4 10*6/uL — AB (ref 3.80–5.20)
RDW: 19.2 % — ABNORMAL HIGH (ref 11.5–14.5)
WBC: 6 x10 3/mm (ref 3.6–11.0)

## 2014-08-09 LAB — COMPREHENSIVE METABOLIC PANEL
ALT: 17 U/L
ANION GAP: 9 (ref 7–16)
Albumin: 3.2 g/dL — ABNORMAL LOW (ref 3.4–5.0)
Alkaline Phosphatase: 121 U/L — ABNORMAL HIGH
BUN: 18 mg/dL (ref 7–18)
Bilirubin,Total: 0.1 mg/dL — ABNORMAL LOW (ref 0.2–1.0)
CO2: 26 mmol/L (ref 21–32)
Calcium, Total: 8.9 mg/dL (ref 8.5–10.1)
Chloride: 110 mmol/L — ABNORMAL HIGH (ref 98–107)
Creatinine: 1.36 mg/dL — ABNORMAL HIGH (ref 0.60–1.30)
EGFR (African American): 48 — ABNORMAL LOW
EGFR (Non-African Amer.): 41 — ABNORMAL LOW
Glucose: 108 mg/dL — ABNORMAL HIGH (ref 65–99)
Osmolality: 291 (ref 275–301)
Potassium: 3.4 mmol/L — ABNORMAL LOW (ref 3.5–5.1)
SGOT(AST): 17 U/L (ref 15–37)
Sodium: 145 mmol/L (ref 136–145)
Total Protein: 6.6 g/dL (ref 6.4–8.2)

## 2014-08-10 ENCOUNTER — Ambulatory Visit: Payer: Self-pay | Admitting: Cardiothoracic Surgery

## 2014-08-18 ENCOUNTER — Ambulatory Visit: Payer: Self-pay | Admitting: Oncology

## 2014-08-23 LAB — COMPREHENSIVE METABOLIC PANEL
ALT: 17 U/L
ANION GAP: 8 (ref 7–16)
Albumin: 3.4 g/dL (ref 3.4–5.0)
Alkaline Phosphatase: 115 U/L
BUN: 12 mg/dL (ref 7–18)
Bilirubin,Total: 0.2 mg/dL (ref 0.2–1.0)
Calcium, Total: 9.1 mg/dL (ref 8.5–10.1)
Chloride: 106 mmol/L (ref 98–107)
Co2: 29 mmol/L (ref 21–32)
Creatinine: 1.35 mg/dL — ABNORMAL HIGH (ref 0.60–1.30)
EGFR (African American): 48 — ABNORMAL LOW
EGFR (Non-African Amer.): 42 — ABNORMAL LOW
GLUCOSE: 98 mg/dL (ref 65–99)
OSMOLALITY: 285 (ref 275–301)
Potassium: 2.9 mmol/L — ABNORMAL LOW (ref 3.5–5.1)
SGOT(AST): 15 U/L (ref 15–37)
Sodium: 143 mmol/L (ref 136–145)
Total Protein: 6.9 g/dL (ref 6.4–8.2)

## 2014-08-23 LAB — CBC CANCER CENTER
BASOS PCT: 2.4 %
Basophil #: 0.1 x10 3/mm (ref 0.0–0.1)
EOS ABS: 0.3 x10 3/mm (ref 0.0–0.7)
Eosinophil %: 11.2 %
HCT: 33.4 % — ABNORMAL LOW (ref 35.0–47.0)
HGB: 11.1 g/dL — ABNORMAL LOW (ref 12.0–16.0)
LYMPHS PCT: 28.2 %
Lymphocyte #: 0.6 x10 3/mm — ABNORMAL LOW (ref 1.0–3.6)
MCH: 30 pg (ref 26.0–34.0)
MCHC: 33 g/dL (ref 32.0–36.0)
MCV: 91 fL (ref 80–100)
Monocyte #: 0.4 x10 3/mm (ref 0.2–0.9)
Monocyte %: 19.8 %
NEUTROS PCT: 38.4 %
Neutrophil #: 0.9 x10 3/mm — ABNORMAL LOW (ref 1.4–6.5)
PLATELETS: 192 x10 3/mm (ref 150–440)
RBC: 3.68 10*6/uL — ABNORMAL LOW (ref 3.80–5.20)
RDW: 18.1 % — ABNORMAL HIGH (ref 11.5–14.5)
WBC: 2.2 x10 3/mm — AB (ref 3.6–11.0)

## 2014-09-09 ENCOUNTER — Ambulatory Visit: Payer: Self-pay | Admitting: Cardiothoracic Surgery

## 2014-09-13 LAB — CBC CANCER CENTER
Basophil #: 0.1 x10 3/mm (ref 0.0–0.1)
Basophil %: 1.6 %
EOS ABS: 0.1 x10 3/mm (ref 0.0–0.7)
Eosinophil %: 2.3 %
HCT: 30 % — AB (ref 35.0–47.0)
HGB: 9.7 g/dL — AB (ref 12.0–16.0)
Lymphocyte #: 0.8 x10 3/mm — ABNORMAL LOW (ref 1.0–3.6)
Lymphocyte %: 17.6 %
MCH: 29.7 pg (ref 26.0–34.0)
MCHC: 32.5 g/dL (ref 32.0–36.0)
MCV: 91 fL (ref 80–100)
Monocyte #: 0.5 x10 3/mm (ref 0.2–0.9)
Monocyte %: 11.8 %
Neutrophil #: 3 x10 3/mm (ref 1.4–6.5)
Neutrophil %: 66.7 %
Platelet: 187 x10 3/mm (ref 150–440)
RBC: 3.28 10*6/uL — ABNORMAL LOW (ref 3.80–5.20)
RDW: 18.3 % — ABNORMAL HIGH (ref 11.5–14.5)
WBC: 4.5 x10 3/mm (ref 3.6–11.0)

## 2014-09-13 LAB — COMPREHENSIVE METABOLIC PANEL
ALBUMIN: 3.2 g/dL — AB (ref 3.4–5.0)
ALK PHOS: 122 U/L — AB
Anion Gap: 7 (ref 7–16)
BILIRUBIN TOTAL: 0.2 mg/dL (ref 0.2–1.0)
BUN: 22 mg/dL — ABNORMAL HIGH (ref 7–18)
CREATININE: 1.31 mg/dL — AB (ref 0.60–1.30)
Calcium, Total: 9.3 mg/dL (ref 8.5–10.1)
Chloride: 110 mmol/L — ABNORMAL HIGH (ref 98–107)
Co2: 28 mmol/L (ref 21–32)
EGFR (African American): 53 — ABNORMAL LOW
GFR CALC NON AF AMER: 44 — AB
GLUCOSE: 86 mg/dL (ref 65–99)
Osmolality: 291 (ref 275–301)
Potassium: 3.2 mmol/L — ABNORMAL LOW (ref 3.5–5.1)
SGOT(AST): 11 U/L — ABNORMAL LOW (ref 15–37)
SGPT (ALT): 16 U/L
Sodium: 145 mmol/L (ref 136–145)
Total Protein: 6.3 g/dL — ABNORMAL LOW (ref 6.4–8.2)

## 2014-09-16 ENCOUNTER — Inpatient Hospital Stay: Payer: Self-pay | Admitting: Internal Medicine

## 2014-09-16 LAB — MAGNESIUM: Magnesium: 1.8 mg/dL

## 2014-09-16 LAB — CBC WITH DIFFERENTIAL/PLATELET
Basophil #: 0 10*3/uL (ref 0.0–0.1)
Basophil %: 1.2 %
EOS ABS: 0.2 10*3/uL (ref 0.0–0.7)
Eosinophil %: 5.9 %
HCT: 27.8 % — AB (ref 35.0–47.0)
HGB: 8.8 g/dL — ABNORMAL LOW (ref 12.0–16.0)
LYMPHS ABS: 0.5 10*3/uL — AB (ref 1.0–3.6)
LYMPHS PCT: 16.7 %
MCH: 29.3 pg (ref 26.0–34.0)
MCHC: 31.7 g/dL — AB (ref 32.0–36.0)
MCV: 92 fL (ref 80–100)
MONO ABS: 0.3 x10 3/mm (ref 0.2–0.9)
Monocyte %: 12.2 %
Neutrophil #: 1.7 10*3/uL (ref 1.4–6.5)
Neutrophil %: 64 %
Platelet: 127 10*3/uL — ABNORMAL LOW (ref 150–440)
RBC: 3.01 10*6/uL — ABNORMAL LOW (ref 3.80–5.20)
RDW: 17.2 % — ABNORMAL HIGH (ref 11.5–14.5)
WBC: 2.7 10*3/uL — ABNORMAL LOW (ref 3.6–11.0)

## 2014-09-16 LAB — URINALYSIS, COMPLETE
Bilirubin,UR: NEGATIVE
Glucose,UR: NEGATIVE mg/dL (ref 0–75)
Hyaline Cast: 18
KETONE: NEGATIVE
NITRITE: POSITIVE
Ph: 8 (ref 4.5–8.0)
RBC,UR: 13 /HPF (ref 0–5)
Specific Gravity: 1.009 (ref 1.003–1.030)
Squamous Epithelial: 1
WBC UR: 739 /HPF (ref 0–5)

## 2014-09-16 LAB — CK TOTAL AND CKMB (NOT AT ARMC)
CK, TOTAL: 67 U/L
CK, TOTAL: 59 U/L
CK, Total: 65 U/L
CK-MB: 1.1 ng/mL (ref 0.5–3.6)
CK-MB: 1.6 ng/mL (ref 0.5–3.6)
CK-MB: 1.7 ng/mL (ref 0.5–3.6)

## 2014-09-16 LAB — TROPONIN I

## 2014-09-16 LAB — BASIC METABOLIC PANEL
Anion Gap: 9 (ref 7–16)
BUN: 13 mg/dL (ref 7–18)
CREATININE: 1.36 mg/dL — AB (ref 0.60–1.30)
Calcium, Total: 8.1 mg/dL — ABNORMAL LOW (ref 8.5–10.1)
Chloride: 111 mmol/L — ABNORMAL HIGH (ref 98–107)
Co2: 24 mmol/L (ref 21–32)
GFR CALC AF AMER: 51 — AB
GFR CALC NON AF AMER: 42 — AB
Glucose: 97 mg/dL (ref 65–99)
OSMOLALITY: 287 (ref 275–301)
Potassium: 3.2 mmol/L — ABNORMAL LOW (ref 3.5–5.1)
SODIUM: 144 mmol/L (ref 136–145)

## 2014-09-16 LAB — ETHANOL: Ethanol: <3 mg/dL

## 2014-09-17 LAB — COMPREHENSIVE METABOLIC PANEL
ALK PHOS: 90 U/L
AST: 10 U/L — AB (ref 15–37)
Albumin: 2.6 g/dL — ABNORMAL LOW (ref 3.4–5.0)
Anion Gap: 8 (ref 7–16)
BILIRUBIN TOTAL: 0.1 mg/dL — AB (ref 0.2–1.0)
BUN: 13 mg/dL (ref 7–18)
CALCIUM: 8.1 mg/dL — AB (ref 8.5–10.1)
CO2: 24 mmol/L (ref 21–32)
Chloride: 116 mmol/L — ABNORMAL HIGH (ref 98–107)
Creatinine: 1.2 mg/dL (ref 0.60–1.30)
EGFR (African American): 58 — ABNORMAL LOW
EGFR (Non-African Amer.): 48 — ABNORMAL LOW
Glucose: 112 mg/dL — ABNORMAL HIGH (ref 65–99)
Osmolality: 295 (ref 275–301)
POTASSIUM: 3.5 mmol/L (ref 3.5–5.1)
SGPT (ALT): 11 U/L — ABNORMAL LOW
Sodium: 148 mmol/L — ABNORMAL HIGH (ref 136–145)
Total Protein: 5.3 g/dL — ABNORMAL LOW (ref 6.4–8.2)

## 2014-09-17 LAB — CBC WITH DIFFERENTIAL/PLATELET
BASOS ABS: 0 10*3/uL (ref 0.0–0.1)
BASOS PCT: 1.2 %
EOS ABS: 0.2 10*3/uL (ref 0.0–0.7)
EOS PCT: 9 %
HCT: 26.2 % — ABNORMAL LOW (ref 35.0–47.0)
HGB: 8.3 g/dL — ABNORMAL LOW (ref 12.0–16.0)
Lymphocyte #: 0.3 10*3/uL — ABNORMAL LOW (ref 1.0–3.6)
Lymphocyte %: 12.7 %
MCH: 29.2 pg (ref 26.0–34.0)
MCHC: 31.6 g/dL — ABNORMAL LOW (ref 32.0–36.0)
MCV: 92 fL (ref 80–100)
MONO ABS: 0.1 x10 3/mm — AB (ref 0.2–0.9)
Monocyte %: 4.7 %
NEUTROS ABS: 1.6 10*3/uL (ref 1.4–6.5)
Neutrophil %: 72.4 %
Platelet: 120 10*3/uL — ABNORMAL LOW (ref 150–440)
RBC: 2.83 10*6/uL — ABNORMAL LOW (ref 3.80–5.20)
RDW: 17.7 % — ABNORMAL HIGH (ref 11.5–14.5)
WBC: 2.2 10*3/uL — ABNORMAL LOW (ref 3.6–11.0)

## 2014-09-17 LAB — CLOSTRIDIUM DIFFICILE(ARMC)

## 2014-09-18 LAB — URINE CULTURE

## 2014-09-19 LAB — URINE CULTURE

## 2014-09-21 LAB — CULTURE, BLOOD (SINGLE)

## 2014-09-27 LAB — COMPREHENSIVE METABOLIC PANEL
ALBUMIN: 3.2 g/dL — AB (ref 3.4–5.0)
ALT: 16 U/L
Alkaline Phosphatase: 140 U/L — ABNORMAL HIGH
Anion Gap: 8 (ref 7–16)
BUN: 9 mg/dL (ref 7–18)
Bilirubin,Total: 0.2 mg/dL (ref 0.2–1.0)
Calcium, Total: 9 mg/dL (ref 8.5–10.1)
Chloride: 105 mmol/L (ref 98–107)
Co2: 28 mmol/L (ref 21–32)
Creatinine: 1.1 mg/dL (ref 0.60–1.30)
EGFR (African American): >60
GFR CALC NON AF AMER: 53 — AB
GLUCOSE: 123 mg/dL — AB (ref 65–99)
Osmolality: 281 (ref 275–301)
Potassium: 2.9 mmol/L — ABNORMAL LOW (ref 3.5–5.1)
SGOT(AST): 15 U/L (ref 15–37)
Sodium: 141 mmol/L (ref 136–145)
Total Protein: 6.5 g/dL (ref 6.4–8.2)

## 2014-09-27 LAB — CBC CANCER CENTER
BASOS PCT: 1.6 %
Basophil #: 0 x10 3/mm (ref 0.0–0.1)
Eosinophil #: 0.2 x10 3/mm (ref 0.0–0.7)
Eosinophil %: 7.4 %
HCT: 26.7 % — ABNORMAL LOW (ref 35.0–47.0)
HGB: 8.8 g/dL — AB (ref 12.0–16.0)
LYMPHS ABS: 0.6 x10 3/mm — AB (ref 1.0–3.6)
LYMPHS PCT: 27.7 %
MCH: 30.1 pg (ref 26.0–34.0)
MCHC: 32.9 g/dL (ref 32.0–36.0)
MCV: 92 fL (ref 80–100)
Monocyte #: 0.4 x10 3/mm (ref 0.2–0.9)
Monocyte %: 20 %
NEUTROS ABS: 0.9 x10 3/mm — AB (ref 1.4–6.5)
Neutrophil %: 43.3 %
PLATELETS: 191 x10 3/mm (ref 150–440)
RBC: 2.91 10*6/uL — ABNORMAL LOW (ref 3.80–5.20)
RDW: 18.5 % — ABNORMAL HIGH (ref 11.5–14.5)
WBC: 2 x10 3/mm — CL (ref 3.6–11.0)

## 2014-10-04 LAB — CBC CANCER CENTER
BASOS ABS: 0.1 x10 3/mm (ref 0.0–0.1)
Basophil %: 1.4 %
EOS PCT: 6.1 %
Eosinophil #: 0.3 x10 3/mm (ref 0.0–0.7)
HCT: 28.8 % — AB (ref 35.0–47.0)
HGB: 9.4 g/dL — ABNORMAL LOW (ref 12.0–16.0)
Lymphocyte #: 1.1 x10 3/mm (ref 1.0–3.6)
Lymphocyte %: 21.8 %
MCH: 30.1 pg (ref 26.0–34.0)
MCHC: 32.5 g/dL (ref 32.0–36.0)
MCV: 93 fL (ref 80–100)
MONO ABS: 0.5 x10 3/mm (ref 0.2–0.9)
Monocyte %: 9.1 %
NEUTROS ABS: 3.1 x10 3/mm (ref 1.4–6.5)
Neutrophil %: 61.6 %
Platelet: 190 x10 3/mm (ref 150–440)
RBC: 3.11 10*6/uL — ABNORMAL LOW (ref 3.80–5.20)
RDW: 18.4 % — AB (ref 11.5–14.5)
WBC: 5.1 x10 3/mm (ref 3.6–11.0)

## 2014-10-04 LAB — COMPREHENSIVE METABOLIC PANEL
ALBUMIN: 3.3 g/dL — AB (ref 3.4–5.0)
ALK PHOS: 144 U/L — AB
ALT: 15 U/L
AST: 16 U/L (ref 15–37)
Anion Gap: 13 (ref 7–16)
BILIRUBIN TOTAL: 0.2 mg/dL (ref 0.2–1.0)
BUN: 9 mg/dL (ref 7–18)
CO2: 27 mmol/L (ref 21–32)
Calcium, Total: 8.8 mg/dL (ref 8.5–10.1)
Chloride: 102 mmol/L (ref 98–107)
Creatinine: 1.27 mg/dL (ref 0.60–1.30)
EGFR (African American): 55 — ABNORMAL LOW
EGFR (Non-African Amer.): 45 — ABNORMAL LOW
Glucose: 127 mg/dL — ABNORMAL HIGH (ref 65–99)
Osmolality: 283 (ref 275–301)
POTASSIUM: 2.9 mmol/L — AB (ref 3.5–5.1)
Sodium: 142 mmol/L (ref 136–145)
Total Protein: 6.7 g/dL (ref 6.4–8.2)

## 2014-10-10 ENCOUNTER — Ambulatory Visit: Payer: Self-pay | Admitting: Cardiothoracic Surgery

## 2014-10-27 ENCOUNTER — Ambulatory Visit: Payer: Self-pay | Admitting: Oncology

## 2014-10-29 LAB — BASIC METABOLIC PANEL
ANION GAP: 6 — AB (ref 7–16)
BUN: 8 mg/dL (ref 7–18)
CHLORIDE: 106 mmol/L (ref 98–107)
Calcium, Total: 9.3 mg/dL (ref 8.5–10.1)
Co2: 29 mmol/L (ref 21–32)
Creatinine: 1.15 mg/dL (ref 0.60–1.30)
EGFR (Non-African Amer.): 51 — ABNORMAL LOW
Glucose: 99 mg/dL (ref 65–99)
OSMOLALITY: 280 (ref 275–301)
Potassium: 3.3 mmol/L — ABNORMAL LOW (ref 3.5–5.1)
Sodium: 141 mmol/L (ref 136–145)

## 2014-10-29 LAB — CBC CANCER CENTER
Basophil #: 0.1 x10 3/mm (ref 0.0–0.1)
Basophil %: 1.5 %
EOS ABS: 0.3 x10 3/mm (ref 0.0–0.7)
EOS PCT: 6.3 %
HCT: 32 % — ABNORMAL LOW (ref 35.0–47.0)
HGB: 10.2 g/dL — AB (ref 12.0–16.0)
Lymphocyte #: 1.4 x10 3/mm (ref 1.0–3.6)
Lymphocyte %: 26 %
MCH: 28.9 pg (ref 26.0–34.0)
MCHC: 32 g/dL (ref 32.0–36.0)
MCV: 90 fL (ref 80–100)
MONOS PCT: 8.8 %
Monocyte #: 0.5 x10 3/mm (ref 0.2–0.9)
NEUTROS ABS: 3 x10 3/mm (ref 1.4–6.5)
Neutrophil %: 57.4 %
Platelet: 252 x10 3/mm (ref 150–440)
RBC: 3.54 10*6/uL — ABNORMAL LOW (ref 3.80–5.20)
RDW: 17.5 % — AB (ref 11.5–14.5)
WBC: 5.3 x10 3/mm (ref 3.6–11.0)

## 2014-10-29 LAB — LACTATE DEHYDROGENASE: LDH: 245 U/L (ref 81–246)

## 2014-11-08 LAB — CBC CANCER CENTER
Basophil #: 0.1 x10 3/mm (ref 0.0–0.1)
Basophil %: 1.2 %
Eosinophil #: 0.2 x10 3/mm (ref 0.0–0.7)
Eosinophil %: 3.1 %
HCT: 26.6 % — ABNORMAL LOW (ref 35.0–47.0)
HGB: 8.7 g/dL — ABNORMAL LOW (ref 12.0–16.0)
Lymphocyte #: 0.9 x10 3/mm — ABNORMAL LOW (ref 1.0–3.6)
Lymphocyte %: 14.9 %
MCH: 28.8 pg (ref 26.0–34.0)
MCHC: 32.7 g/dL (ref 32.0–36.0)
MCV: 88 fL (ref 80–100)
Monocyte #: 0.8 x10 3/mm (ref 0.2–0.9)
Monocyte %: 13.7 %
NEUTROS ABS: 3.8 x10 3/mm (ref 1.4–6.5)
Neutrophil %: 67.1 %
Platelet: 282 x10 3/mm (ref 150–440)
RBC: 3.02 10*6/uL — ABNORMAL LOW (ref 3.80–5.20)
RDW: 18.4 % — ABNORMAL HIGH (ref 11.5–14.5)
WBC: 5.7 x10 3/mm (ref 3.6–11.0)

## 2014-11-08 LAB — COMPREHENSIVE METABOLIC PANEL
ALK PHOS: 109 U/L
AST: 20 U/L (ref 15–37)
Albumin: 3 g/dL — ABNORMAL LOW (ref 3.4–5.0)
Anion Gap: 11 (ref 7–16)
BUN: 17 mg/dL (ref 7–18)
Bilirubin,Total: 0.2 mg/dL (ref 0.2–1.0)
CREATININE: 1.52 mg/dL — AB (ref 0.60–1.30)
Calcium, Total: 9.6 mg/dL (ref 8.5–10.1)
Chloride: 103 mmol/L (ref 98–107)
Co2: 26 mmol/L (ref 21–32)
EGFR (African American): 44 — ABNORMAL LOW
EGFR (Non-African Amer.): 37 — ABNORMAL LOW
Glucose: 122 mg/dL — ABNORMAL HIGH (ref 65–99)
Osmolality: 282 (ref 275–301)
POTASSIUM: 3.1 mmol/L — AB (ref 3.5–5.1)
SGPT (ALT): 19 U/L
Sodium: 140 mmol/L (ref 136–145)
Total Protein: 7.4 g/dL (ref 6.4–8.2)

## 2014-11-08 LAB — LACTATE DEHYDROGENASE: LDH: 200 U/L (ref 81–246)

## 2014-11-09 ENCOUNTER — Ambulatory Visit: Payer: Self-pay | Admitting: Oncology

## 2014-11-15 LAB — CBC CANCER CENTER
Basophil #: 0 x10 3/mm (ref 0.0–0.1)
Basophil %: 1.2 %
EOS ABS: 0.2 x10 3/mm (ref 0.0–0.7)
Eosinophil %: 4.7 %
HCT: 27.1 % — AB (ref 35.0–47.0)
HGB: 8.7 g/dL — ABNORMAL LOW (ref 12.0–16.0)
Lymphocyte #: 0.3 x10 3/mm — ABNORMAL LOW (ref 1.0–3.6)
Lymphocyte %: 8.1 %
MCH: 27.6 pg (ref 26.0–34.0)
MCHC: 32 g/dL (ref 32.0–36.0)
MCV: 86 fL (ref 80–100)
MONO ABS: 0.7 x10 3/mm (ref 0.2–0.9)
Monocyte %: 20.2 %
NEUTROS ABS: 2.2 x10 3/mm (ref 1.4–6.5)
Neutrophil %: 65.8 %
PLATELETS: 285 x10 3/mm (ref 150–440)
RBC: 3.15 10*6/uL — ABNORMAL LOW (ref 3.80–5.20)
RDW: 19.4 % — AB (ref 11.5–14.5)
WBC: 3.4 x10 3/mm — ABNORMAL LOW (ref 3.6–11.0)

## 2014-11-15 LAB — BASIC METABOLIC PANEL
Anion Gap: 12 (ref 7–16)
BUN: 13 mg/dL (ref 7–18)
CALCIUM: 9.7 mg/dL (ref 8.5–10.1)
CHLORIDE: 102 mmol/L (ref 98–107)
CREATININE: 1.49 mg/dL — AB (ref 0.60–1.30)
Co2: 26 mmol/L (ref 21–32)
EGFR (African American): 46 — ABNORMAL LOW
EGFR (Non-African Amer.): 38 — ABNORMAL LOW
GLUCOSE: 105 mg/dL — AB (ref 65–99)
OSMOLALITY: 280 (ref 275–301)
Potassium: 2.9 mmol/L — ABNORMAL LOW (ref 3.5–5.1)
Sodium: 140 mmol/L (ref 136–145)

## 2014-12-06 LAB — CBC CANCER CENTER
BASOS ABS: 0.1 x10 3/mm (ref 0.0–0.1)
Basophil %: 1.2 %
Eosinophil #: 0.3 x10 3/mm (ref 0.0–0.7)
Eosinophil %: 7.6 %
HCT: 29.3 % — ABNORMAL LOW (ref 35.0–47.0)
HGB: 9.4 g/dL — ABNORMAL LOW (ref 12.0–16.0)
LYMPHS PCT: 8.3 %
Lymphocyte #: 0.4 x10 3/mm — ABNORMAL LOW (ref 1.0–3.6)
MCH: 27.4 pg (ref 26.0–34.0)
MCHC: 32 g/dL (ref 32.0–36.0)
MCV: 86 fL (ref 80–100)
MONOS PCT: 10.9 %
Monocyte #: 0.5 x10 3/mm (ref 0.2–0.9)
NEUTROS ABS: 3.3 x10 3/mm (ref 1.4–6.5)
Neutrophil %: 72 %
PLATELETS: 230 x10 3/mm (ref 150–440)
RBC: 3.42 10*6/uL — ABNORMAL LOW (ref 3.80–5.20)
RDW: 18.5 % — AB (ref 11.5–14.5)
WBC: 4.6 x10 3/mm (ref 3.6–11.0)

## 2014-12-06 LAB — COMPREHENSIVE METABOLIC PANEL
ALK PHOS: 98 U/L
ANION GAP: 12 (ref 7–16)
Albumin: 3 g/dL — ABNORMAL LOW (ref 3.4–5.0)
BILIRUBIN TOTAL: 0.2 mg/dL (ref 0.2–1.0)
BUN: 9 mg/dL (ref 7–18)
CREATININE: 1.38 mg/dL — AB (ref 0.60–1.30)
Calcium, Total: 9.2 mg/dL (ref 8.5–10.1)
Chloride: 104 mmol/L (ref 98–107)
Co2: 25 mmol/L (ref 21–32)
GFR CALC AF AMER: 50 — AB
GFR CALC NON AF AMER: 41 — AB
GLUCOSE: 119 mg/dL — AB (ref 65–99)
OSMOLALITY: 281 (ref 275–301)
Potassium: 2.6 mmol/L — ABNORMAL LOW (ref 3.5–5.1)
SGOT(AST): 17 U/L (ref 15–37)
SGPT (ALT): 17 U/L
SODIUM: 141 mmol/L (ref 136–145)
Total Protein: 6.8 g/dL (ref 6.4–8.2)

## 2014-12-10 ENCOUNTER — Ambulatory Visit: Payer: Self-pay | Admitting: Oncology

## 2014-12-19 IMAGING — CT NM PET TUM IMG RESTAG (PS) SKULL BASE T - THIGH
1 of 10 series · 1 of 25 positions shown · non-contrast
Comparison: 08/18/2014

CLINICAL DATA: Subsequent treatment strategy for lymphoma.

EXAM:
NUCLEAR MEDICINE PET SKULL BASE TO THIGH
TECHNIQUE: 12.88 mCi F-18 FDG was injected intravenously. Full-ring PET imaging
was performed from the skull base to thigh after the radiotracer. CT
data was obtained and used for attenuation correction and anatomic
localization.
FASTING BLOOD GLUCOSE:  Value: 111 mg/dl

[Series 3: ct wb 5.0 b30f · axial · 5.0mm · 0.98mm/px · 1 of 290 slices shown]
[im 290/290  brain]
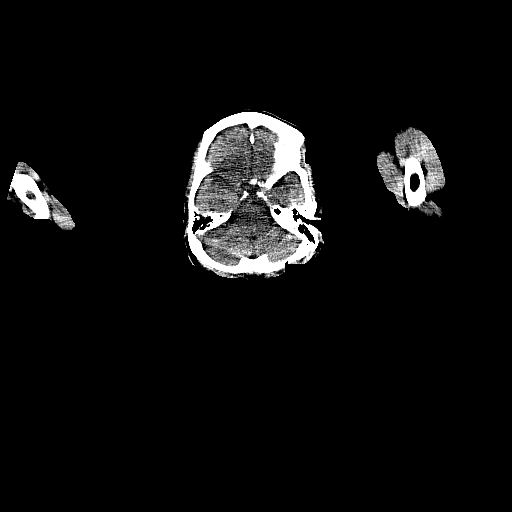

[1 of 25 positions shown; findings below may reference images not displayed]

FINDINGS: NECK

Hypermetabolic right supraclavicular mass measures 3.5 x 1.9 cm and
has an SUV max equal to 8.6. On the previous exam this measured
x 1.3 cm and had an SUV max equal to 3.48.

CHEST

Hypermetabolic right paratracheal lymph node measures 2.7 cm and has
an SUV max equal to 11.3. On the previous exam this measured 1.7 cm
and had an SUV max equal to 7.9. Pulmonary nodule within the right
upper lobe measures 1.4 cm and has an SUV max equal to 3.3.
Previously this measured 1.1 cm and had an SUV max equal to 0.97.

ABDOMEN/PELVIS

No abnormal hypermetabolic activity within the liver, pancreas,
adrenal glands, or spleen. The index periaortic lymph node measures
1 cm without FDG uptake. Previously this measured 1.2 cm and had an
SUV max equal to 2.2.

SKELETON

Heterogeneous radiotracer uptake is identified within the visualized
axial and appendicular skeleton. Multifocal areas of relative
photopenia are identified within the upper and lower thoracic spine
as well as within the lower lumbar spine at the L5 level. The
photopenic areas may represent areas that have been the have been
included in external beam radiation port. There is at least 1
intense focus of increased uptake within the C6 vertebra which is
new from the previous exam and is concerning for tumor. On the
corresponding CT images there is abnormal asymmetric sclerosis in
this area. This has an SUV max equal to 7.3.
IMPRESSION: 1. Interval progression of disease. Right supraclavicular and right
paratracheal tumor have increased in size and degree of FDG uptake
in the interval. The pulmonary nodule within the right upper lobe
has also increased in size and degree of FDG uptake.
2. Intense radiotracer uptake localizing to the C6 vertebra is
worrisome for tumor.
3. Heterogeneous uptake within the remaining portions of the axial
and appendicular skeleton likely reflects a combination of post
treatment changes as well as residual tumor.

## 2014-12-20 LAB — CBC CANCER CENTER
Basophil #: 0.1 x10 3/mm (ref 0.0–0.1)
Basophil %: 1.4 %
EOS ABS: 0.4 x10 3/mm (ref 0.0–0.7)
Eosinophil %: 8.5 %
HCT: 29.6 % — ABNORMAL LOW (ref 35.0–47.0)
HGB: 9.6 g/dL — ABNORMAL LOW (ref 12.0–16.0)
LYMPHS ABS: 0.4 x10 3/mm — AB (ref 1.0–3.6)
Lymphocyte %: 9.4 %
MCH: 27.8 pg (ref 26.0–34.0)
MCHC: 32.5 g/dL (ref 32.0–36.0)
MCV: 85 fL (ref 80–100)
MONOS PCT: 11.1 %
Monocyte #: 0.5 x10 3/mm (ref 0.2–0.9)
Neutrophil #: 3.1 x10 3/mm (ref 1.4–6.5)
Neutrophil %: 69.6 %
PLATELETS: 232 x10 3/mm (ref 150–440)
RBC: 3.46 10*6/uL — ABNORMAL LOW (ref 3.80–5.20)
RDW: 19.9 % — ABNORMAL HIGH (ref 11.5–14.5)
WBC: 4.4 x10 3/mm (ref 3.6–11.0)

## 2014-12-20 LAB — COMPREHENSIVE METABOLIC PANEL
ANION GAP: 8 (ref 7–16)
AST: 15 U/L (ref 15–37)
Albumin: 3.2 g/dL — ABNORMAL LOW (ref 3.4–5.0)
Alkaline Phosphatase: 103 U/L
BUN: 11 mg/dL (ref 7–18)
Bilirubin,Total: 0.2 mg/dL (ref 0.2–1.0)
CALCIUM: 9.2 mg/dL (ref 8.5–10.1)
CHLORIDE: 101 mmol/L (ref 98–107)
Co2: 29 mmol/L (ref 21–32)
Creatinine: 1.11 mg/dL (ref 0.60–1.30)
GFR CALC NON AF AMER: 53 — AB
Glucose: 102 mg/dL — ABNORMAL HIGH (ref 65–99)
Osmolality: 275 (ref 275–301)
Potassium: 3.5 mmol/L (ref 3.5–5.1)
SGPT (ALT): 13 U/L — ABNORMAL LOW
Sodium: 138 mmol/L (ref 136–145)
Total Protein: 7.1 g/dL (ref 6.4–8.2)

## 2014-12-20 LAB — LACTATE DEHYDROGENASE: LDH: 247 U/L — AB (ref 81–246)

## 2014-12-27 LAB — CBC CANCER CENTER
Basophil #: 0 x10 3/mm (ref 0.0–0.1)
Basophil %: 1.7 %
EOS PCT: 9 %
Eosinophil #: 0.2 x10 3/mm (ref 0.0–0.7)
HCT: 30.1 % — ABNORMAL LOW (ref 35.0–47.0)
HGB: 9.8 g/dL — ABNORMAL LOW (ref 12.0–16.0)
LYMPHS ABS: 0.3 x10 3/mm — AB (ref 1.0–3.6)
Lymphocyte %: 12.1 %
MCH: 28.2 pg (ref 26.0–34.0)
MCHC: 32.5 g/dL (ref 32.0–36.0)
MCV: 87 fL (ref 80–100)
Monocyte #: 0.5 x10 3/mm (ref 0.2–0.9)
Monocyte %: 18.4 %
Neutrophil #: 1.5 x10 3/mm (ref 1.4–6.5)
Neutrophil %: 58.8 %
PLATELETS: 211 x10 3/mm (ref 150–440)
RBC: 3.47 10*6/uL — AB (ref 3.80–5.20)
RDW: 20.3 % — ABNORMAL HIGH (ref 11.5–14.5)
WBC: 2.5 x10 3/mm — AB (ref 3.6–11.0)

## 2014-12-27 LAB — BASIC METABOLIC PANEL
Anion Gap: 9 (ref 7–16)
BUN: 10 mg/dL (ref 7–18)
CALCIUM: 8.9 mg/dL (ref 8.5–10.1)
Chloride: 101 mmol/L (ref 98–107)
Co2: 29 mmol/L (ref 21–32)
Creatinine: 1.03 mg/dL (ref 0.60–1.30)
EGFR (African American): >60
EGFR (Non-African Amer.): 58 — ABNORMAL LOW
GLUCOSE: 87 mg/dL (ref 65–99)
OSMOLALITY: 276 (ref 275–301)
Potassium: 2.9 mmol/L — ABNORMAL LOW (ref 3.5–5.1)
SODIUM: 139 mmol/L (ref 136–145)

## 2015-01-10 ENCOUNTER — Ambulatory Visit: Payer: Self-pay | Admitting: Oncology

## 2015-01-10 ENCOUNTER — Observation Stay: Payer: Self-pay | Admitting: Oncology

## 2015-01-10 LAB — URINALYSIS, COMPLETE
Bacteria: NONE SEEN
Bilirubin,UR: NEGATIVE
Blood: NEGATIVE
Glucose,UR: NEGATIVE mg/dL (ref 0–75)
KETONE: NEGATIVE
NITRITE: POSITIVE
PH: 7 (ref 4.5–8.0)
Protein: NEGATIVE
RBC,UR: NONE SEEN /HPF (ref 0–5)
SPECIFIC GRAVITY: 1.009 (ref 1.003–1.030)
Squamous Epithelial: 1

## 2015-01-10 LAB — CBC CANCER CENTER
BASOS ABS: 0 x10 3/mm (ref 0.0–0.1)
BASOS PCT: 1.2 %
EOS PCT: 8.8 %
Eosinophil #: 0.3 x10 3/mm (ref 0.0–0.7)
HCT: 30.7 % — ABNORMAL LOW (ref 35.0–47.0)
HGB: 9.9 g/dL — AB (ref 12.0–16.0)
LYMPHS ABS: 0.5 x10 3/mm — AB (ref 1.0–3.6)
Lymphocyte %: 12.4 %
MCH: 28.2 pg (ref 26.0–34.0)
MCHC: 32.2 g/dL (ref 32.0–36.0)
MCV: 87 fL (ref 80–100)
MONOS PCT: 9.6 %
Monocyte #: 0.4 x10 3/mm (ref 0.2–0.9)
NEUTROS ABS: 2.7 x10 3/mm (ref 1.4–6.5)
Neutrophil %: 68 %
Platelet: 227 x10 3/mm (ref 150–440)
RBC: 3.51 10*6/uL — AB (ref 3.80–5.20)
RDW: 21.1 % — ABNORMAL HIGH (ref 11.5–14.5)
WBC: 3.9 x10 3/mm (ref 3.6–11.0)

## 2015-01-10 LAB — COMPREHENSIVE METABOLIC PANEL
ALBUMIN: 3.3 g/dL — AB (ref 3.4–5.0)
ALT: 14 U/L (ref 14–63)
AST: 14 U/L — AB (ref 15–37)
Alkaline Phosphatase: 109 U/L (ref 46–116)
Anion Gap: 12 (ref 7–16)
BUN: 10 mg/dL (ref 7–18)
Bilirubin,Total: 0.2 mg/dL (ref 0.2–1.0)
Calcium, Total: 9.5 mg/dL (ref 8.5–10.1)
Chloride: 103 mmol/L (ref 98–107)
Co2: 27 mmol/L (ref 21–32)
Creatinine: 1.28 mg/dL (ref 0.60–1.30)
EGFR (African American): 54 — ABNORMAL LOW
GFR CALC NON AF AMER: 45 — AB
GLUCOSE: 170 mg/dL — AB (ref 65–99)
Osmolality: 286 (ref 275–301)
Potassium: 3.3 mmol/L — ABNORMAL LOW (ref 3.5–5.1)
Sodium: 142 mmol/L (ref 136–145)
TOTAL PROTEIN: 7.4 g/dL (ref 6.4–8.2)

## 2015-01-11 LAB — BASIC METABOLIC PANEL
ANION GAP: 8 (ref 7–16)
BUN: 9 mg/dL (ref 7–18)
CALCIUM: 9.2 mg/dL (ref 8.5–10.1)
Chloride: 108 mmol/L — ABNORMAL HIGH (ref 98–107)
Co2: 28 mmol/L (ref 21–32)
Creatinine: 1.04 mg/dL (ref 0.60–1.30)
EGFR (Non-African Amer.): 57 — ABNORMAL LOW
Glucose: 93 mg/dL (ref 65–99)
OSMOLALITY: 285 (ref 275–301)
Potassium: 3.3 mmol/L — ABNORMAL LOW (ref 3.5–5.1)
Sodium: 144 mmol/L (ref 136–145)

## 2015-01-14 LAB — URINE CULTURE

## 2015-02-08 ENCOUNTER — Ambulatory Visit
Admit: 2015-02-08 | Disposition: A | Discharge status: Home / Self Care | Payer: Self-pay | Attending: Oncology | Admitting: Oncology

## 2015-02-22 ENCOUNTER — Inpatient Hospital Stay: Payer: Self-pay | Admitting: Internal Medicine

## 2015-03-09 LAB — CBC CANCER CENTER
BASOS PCT: 1.1 %
Basophil #: 0 x10 3/mm (ref 0.0–0.1)
EOS PCT: 9.5 %
Eosinophil #: 0.3 x10 3/mm (ref 0.0–0.7)
HCT: 29.4 % — AB (ref 35.0–47.0)
HGB: 9.8 g/dL — ABNORMAL LOW (ref 12.0–16.0)
LYMPHS ABS: 0.4 x10 3/mm — AB (ref 1.0–3.6)
Lymphocyte %: 11.1 %
MCH: 31 pg (ref 26.0–34.0)
MCHC: 33.3 g/dL (ref 32.0–36.0)
MCV: 93 fL (ref 80–100)
MONO ABS: 0.5 x10 3/mm (ref 0.2–0.9)
Monocyte %: 14.8 %
NEUTROS ABS: 2.2 x10 3/mm (ref 1.4–6.5)
Neutrophil %: 63.5 %
PLATELETS: 127 x10 3/mm — AB (ref 150–440)
RBC: 3.15 10*6/uL — AB (ref 3.80–5.20)
RDW: 18.8 % — ABNORMAL HIGH (ref 11.5–14.5)
WBC: 3.5 x10 3/mm — ABNORMAL LOW (ref 3.6–11.0)

## 2015-03-09 LAB — COMPREHENSIVE METABOLIC PANEL
ALT: 25 U/L
AST: 29 U/L
Albumin: 3.6 g/dL
Alkaline Phosphatase: 114 U/L
Anion Gap: 6 — ABNORMAL LOW (ref 7–16)
BILIRUBIN TOTAL: 0.5 mg/dL
BUN: 13 mg/dL
CHLORIDE: 100 mmol/L — AB
CO2: 27 mmol/L
Calcium, Total: 9.1 mg/dL
Creatinine: 1 mg/dL
GFR CALC NON AF AMER: 59 — AB
GLUCOSE: 107 mg/dL — AB
POTASSIUM: 2.8 mmol/L — AB
SODIUM: 133 mmol/L — AB
Total Protein: 6.5 g/dL

## 2015-03-11 ENCOUNTER — Ambulatory Visit
Admit: 2015-03-11 | Disposition: A | Discharge status: Home / Self Care | Payer: Self-pay | Attending: Oncology | Admitting: Oncology

## 2015-03-14 ENCOUNTER — Inpatient Hospital Stay
Admit: 2015-03-14 | Disposition: A | Discharge status: Home / Self Care | Payer: Self-pay | Attending: Internal Medicine | Admitting: Internal Medicine

## 2015-03-14 LAB — URINALYSIS, COMPLETE
Bilirubin,UR: NEGATIVE
GLUCOSE, UR: NEGATIVE mg/dL (ref 0–75)
Ketone: NEGATIVE
NITRITE: NEGATIVE
PROTEIN: NEGATIVE
Ph: 6 (ref 4.5–8.0)
RBC,UR: 2 /HPF (ref 0–5)
Specific Gravity: 1.006 (ref 1.003–1.030)
Squamous Epithelial: NONE SEEN

## 2015-03-14 LAB — CBC WITH DIFFERENTIAL/PLATELET
BANDS NEUTROPHIL: 4 %
COMMENT - H1-COM3: NORMAL
EOS PCT: 1 %
HCT: 30 % — ABNORMAL LOW (ref 35.0–47.0)
HGB: 9.7 g/dL — AB (ref 12.0–16.0)
Lymphocytes: 9 %
MCH: 30.5 pg (ref 26.0–34.0)
MCHC: 32.2 g/dL (ref 32.0–36.0)
MCV: 95 fL (ref 80–100)
Metamyelocyte: 2 %
Monocytes: 13 %
Platelet: 133 10*3/uL — ABNORMAL LOW (ref 150–440)
RBC: 3.17 10*6/uL — ABNORMAL LOW (ref 3.80–5.20)
RDW: 18.7 % — ABNORMAL HIGH (ref 11.5–14.5)
SEGMENTED NEUTROPHILS: 71 %
WBC: 3.5 10*3/uL — ABNORMAL LOW (ref 3.6–11.0)

## 2015-03-14 LAB — PROTIME-INR
INR: 0.9
Prothrombin Time: 12.8 secs

## 2015-03-14 LAB — COMPREHENSIVE METABOLIC PANEL
ALBUMIN: 3.3 g/dL — AB
ALK PHOS: 86 U/L
Anion Gap: 9 (ref 7–16)
BILIRUBIN TOTAL: 0.6 mg/dL
BUN: 23 mg/dL — ABNORMAL HIGH
CALCIUM: 8.9 mg/dL
CO2: 26 mmol/L
Chloride: 98 mmol/L — ABNORMAL LOW
Creatinine: 1.26 mg/dL — ABNORMAL HIGH
EGFR (African American): 52 — ABNORMAL LOW
EGFR (Non-African Amer.): 45 — ABNORMAL LOW
GLUCOSE: 101 mg/dL — AB
POTASSIUM: 3.3 mmol/L — AB
SGOT(AST): 19 U/L
SGPT (ALT): 11 U/L — ABNORMAL LOW
SODIUM: 133 mmol/L — AB
TOTAL PROTEIN: 6.2 g/dL — AB

## 2015-03-14 LAB — MAGNESIUM: MAGNESIUM: 1.7 mg/dL

## 2015-03-14 LAB — TROPONIN I: Troponin-I: 0.03 ng/mL

## 2015-03-14 LAB — PHOSPHORUS: Phosphorus: 3.9 mg/dL

## 2015-03-14 LAB — LACTIC ACID, PLASMA: LACTIC ACID, VENOUS: 1.3 mmol/L

## 2015-03-14 LAB — CARBAMAZEPINE LEVEL, TOTAL: Carbamazepine: <2 ug/mL (ref 4.0–12.0)

## 2015-03-15 LAB — BASIC METABOLIC PANEL
Anion Gap: 7 (ref 7–16)
BUN: 18 mg/dL
CHLORIDE: 110 mmol/L
CREATININE: 1.14 mg/dL — AB
Calcium, Total: 8.8 mg/dL — ABNORMAL LOW
Co2: 27 mmol/L
EGFR (African American): 59 — ABNORMAL LOW
EGFR (Non-African Amer.): 51 — ABNORMAL LOW
Glucose: 124 mg/dL — ABNORMAL HIGH
Potassium: 2.9 mmol/L — ABNORMAL LOW
Sodium: 143 mmol/L

## 2015-03-15 LAB — CBC WITH DIFFERENTIAL/PLATELET
BASOS ABS: 0 10*3/uL (ref 0.0–0.1)
Basophil %: 0.8 %
EOS ABS: 0.1 10*3/uL (ref 0.0–0.7)
Eosinophil %: 4.6 %
HCT: 30.9 % — ABNORMAL LOW (ref 35.0–47.0)
HGB: 10 g/dL — ABNORMAL LOW (ref 12.0–16.0)
Lymphocyte #: 0.2 10*3/uL — ABNORMAL LOW (ref 1.0–3.6)
Lymphocyte %: 7.8 %
MCH: 30.4 pg (ref 26.0–34.0)
MCHC: 32.3 g/dL (ref 32.0–36.0)
MCV: 94 fL (ref 80–100)
MONOS PCT: 11.2 %
Monocyte #: 0.3 x10 3/mm (ref 0.2–0.9)
NEUTROS ABS: 2.1 10*3/uL (ref 1.4–6.5)
NEUTROS PCT: 75.6 %
Platelet: 141 10*3/uL — ABNORMAL LOW (ref 150–440)
RBC: 3.29 10*6/uL — AB (ref 3.80–5.20)
RDW: 18.6 % — AB (ref 11.5–14.5)
WBC: 2.8 10*3/uL — AB (ref 3.6–11.0)

## 2015-03-15 LAB — CREATININE, SERUM: Creatine, Serum: 1.26

## 2015-03-16 LAB — URINE CULTURE

## 2015-03-16 LAB — MAGNESIUM: Magnesium: 1.4 mg/dL — ABNORMAL LOW

## 2015-03-19 LAB — CULTURE, BLOOD (SINGLE)

## 2015-03-25 ENCOUNTER — Telehealth: Payer: Self-pay | Admitting: *Deleted

## 2015-03-25 ENCOUNTER — Encounter: Payer: Self-pay | Admitting: *Deleted

## 2015-03-25 NOTE — Telephone Encounter (Signed)
Medical records received from [facility] and [facility]. Forwarded to Karen/Edward. JG//CMA

## 2015-03-25 NOTE — Telephone Encounter (Signed)
Called patient to complete Pre-Visit Call and spoke with patient.  Patient was unable to provide information, advised her to have a family member accompany her to doctor's visit.  Patient agreed and confirmed time of appointment.

## 2015-03-28 ENCOUNTER — Encounter: Payer: Self-pay | Admitting: Medical

## 2015-03-28 ENCOUNTER — Other Ambulatory Visit: Payer: Self-pay | Admitting: Medical

## 2015-03-28 ENCOUNTER — Ambulatory Visit (HOSPITAL_BASED_OUTPATIENT_CLINIC_OR_DEPARTMENT_OTHER)
Admission: RE | Admit: 2015-03-28 | Discharge: 2015-03-28 | Disposition: A | Discharge status: Home / Self Care | Payer: Medicare Other | Source: Ambulatory Visit | Attending: Medical | Admitting: Medical

## 2015-03-28 ENCOUNTER — Ambulatory Visit (INDEPENDENT_AMBULATORY_CARE_PROVIDER_SITE_OTHER): Payer: Medicare Other | Admitting: Medical

## 2015-03-28 ENCOUNTER — Telehealth: Payer: Self-pay | Admitting: Medical

## 2015-03-28 VITALS — BP 94/69 | HR 87 | Temp 98.5°F | Ht 66.5 in | Wt 177.2 lb

## 2015-03-28 DIAGNOSIS — D649 Anemia, unspecified: Secondary | ICD-10-CM

## 2015-03-28 DIAGNOSIS — S8264XA Nondisplaced fracture of lateral malleolus of right fibula, initial encounter for closed fracture: Secondary | ICD-10-CM | POA: Insufficient documentation

## 2015-03-28 DIAGNOSIS — M1990 Unspecified osteoarthritis, unspecified site: Secondary | ICD-10-CM | POA: Insufficient documentation

## 2015-03-28 DIAGNOSIS — Z720 Tobacco use: Secondary | ICD-10-CM

## 2015-03-28 DIAGNOSIS — M25571 Pain in right ankle and joints of right foot: Secondary | ICD-10-CM | POA: Diagnosis present

## 2015-03-28 DIAGNOSIS — N390 Urinary tract infection, site not specified: Secondary | ICD-10-CM | POA: Diagnosis not present

## 2015-03-28 DIAGNOSIS — R790 Abnormal level of blood mineral: Secondary | ICD-10-CM

## 2015-03-28 DIAGNOSIS — S82891A Other fracture of right lower leg, initial encounter for closed fracture: Secondary | ICD-10-CM

## 2015-03-28 DIAGNOSIS — M7989 Other specified soft tissue disorders: Secondary | ICD-10-CM | POA: Diagnosis not present

## 2015-03-28 DIAGNOSIS — M109 Gout, unspecified: Secondary | ICD-10-CM | POA: Insufficient documentation

## 2015-03-28 DIAGNOSIS — K219 Gastro-esophageal reflux disease without esophagitis: Secondary | ICD-10-CM

## 2015-03-28 DIAGNOSIS — I509 Heart failure, unspecified: Secondary | ICD-10-CM

## 2015-03-28 DIAGNOSIS — M150 Primary generalized (osteo)arthritis: Secondary | ICD-10-CM

## 2015-03-28 DIAGNOSIS — Z8709 Personal history of other diseases of the respiratory system: Secondary | ICD-10-CM

## 2015-03-28 DIAGNOSIS — A419 Sepsis, unspecified organism: Secondary | ICD-10-CM

## 2015-03-28 DIAGNOSIS — C8590 Non-Hodgkin lymphoma, unspecified, unspecified site: Secondary | ICD-10-CM

## 2015-03-28 DIAGNOSIS — Z8673 Personal history of transient ischemic attack (TIA), and cerebral infarction without residual deficits: Secondary | ICD-10-CM | POA: Insufficient documentation

## 2015-03-28 DIAGNOSIS — F17200 Nicotine dependence, unspecified, uncomplicated: Secondary | ICD-10-CM

## 2015-03-28 DIAGNOSIS — I10 Essential (primary) hypertension: Secondary | ICD-10-CM

## 2015-03-28 DIAGNOSIS — N189 Chronic kidney disease, unspecified: Secondary | ICD-10-CM

## 2015-03-28 DIAGNOSIS — M1000 Idiopathic gout, unspecified site: Secondary | ICD-10-CM

## 2015-03-28 DIAGNOSIS — M79641 Pain in right hand: Secondary | ICD-10-CM | POA: Insufficient documentation

## 2015-03-28 DIAGNOSIS — E876 Hypokalemia: Secondary | ICD-10-CM | POA: Diagnosis not present

## 2015-03-28 DIAGNOSIS — F209 Schizophrenia, unspecified: Secondary | ICD-10-CM | POA: Insufficient documentation

## 2015-03-28 DIAGNOSIS — M25579 Pain in unspecified ankle and joints of unspecified foot: Secondary | ICD-10-CM | POA: Insufficient documentation

## 2015-03-28 DIAGNOSIS — W19XXXA Unspecified fall, initial encounter: Secondary | ICD-10-CM | POA: Diagnosis not present

## 2015-03-28 DIAGNOSIS — F319 Bipolar disorder, unspecified: Secondary | ICD-10-CM | POA: Insufficient documentation

## 2015-03-28 DIAGNOSIS — N393 Stress incontinence (female) (male): Secondary | ICD-10-CM

## 2015-03-28 DIAGNOSIS — M159 Polyosteoarthritis, unspecified: Secondary | ICD-10-CM

## 2015-03-28 HISTORY — DX: Gastro-esophageal reflux disease without esophagitis: K21.9

## 2015-03-28 HISTORY — DX: Chronic kidney disease, unspecified: N18.9

## 2015-03-28 HISTORY — DX: Essential (primary) hypertension: I10

## 2015-03-28 LAB — COMPREHENSIVE METABOLIC PANEL
ALBUMIN: 3.8 g/dL (ref 3.5–5.2)
ALT: 15 U/L (ref 0–35)
AST: 16 U/L (ref 0–37)
Alkaline Phosphatase: 94 U/L (ref 39–117)
BUN: 19 mg/dL (ref 6–23)
CO2: 29 mEq/L (ref 19–32)
Calcium: 10.1 mg/dL (ref 8.4–10.5)
Chloride: 100 mEq/L (ref 96–112)
Creatinine, Ser: 1.17 mg/dL (ref 0.40–1.20)
GFR: 59.87 mL/min — ABNORMAL LOW (ref >60.00)
GLUCOSE: 109 mg/dL — AB (ref 70–99)
POTASSIUM: 3.4 meq/L — AB (ref 3.5–5.1)
Sodium: 136 mEq/L (ref 135–145)
Total Bilirubin: 0.2 mg/dL (ref 0.2–1.2)
Total Protein: 7 g/dL (ref 6.0–8.3)

## 2015-03-28 LAB — MAGNESIUM: Magnesium: 1.8 mg/dL (ref 1.5–2.5)

## 2015-03-28 NOTE — Assessment & Plan Note (Signed)
Rt medial ankle pain early on after fall but now none.No warmth. No pain on palpation. Get xray rule out fx.

## 2015-03-28 NOTE — Assessment & Plan Note (Signed)
Check mg level today.

## 2015-03-28 NOTE — Assessment & Plan Note (Signed)
Minimal faint pain early on after fall. None now. Maybe fx that pain meds area covering up. Xray today.

## 2015-03-28 NOTE — Assessment & Plan Note (Addendum)
Daily and for years. Pt is on ditropan. So I will go ahead and refer to urologsit.

## 2015-03-28 NOTE — Assessment & Plan Note (Signed)
Sees specialist and appears stable. Specialist prescribing her pain meds.

## 2015-03-28 NOTE — Assessment & Plan Note (Signed)
Hx of but last two years low bp and son states she is no longer on any bp meds.

## 2015-03-28 NOTE — Assessment & Plan Note (Signed)
Check k level today.

## 2015-03-28 NOTE — Progress Notes (Signed)
Pre visit review using our clinic review tool, if applicable. No additional management support is needed unless otherwise documented below in the visit note. 

## 2015-03-28 NOTE — Assessment & Plan Note (Signed)
9 hosptialilzation with symptoms. Most recent culture was negative per hospital note review.

## 2015-03-28 NOTE — Progress Notes (Signed)
Subjective:    Patient ID: Veronica [name], female    DOB: [birth year], 64 y.o.   MRN: [id number]  HPI  I have reviewed pt PMH, PSH, FH, Social History and Surgical History  Pt in for recurrent urinary tract symptoms/uti.9 hospitalizations with urinary symptoms over 1 year. Pt has had some positive cultures in past. This most recent hosptialization the cuulture was negative. Pt was cephalexin.  Pt had low K and low mg.  Pt has some acute renal failure on top of chronic on note review.   Hx of hypertension. But last 2 years she was hypotensive.  2 weeks since discharge from the hospital.   Pt fell 3 days ago. Rt medial ankle swollen. Also has some rt hand swelling/3rd digit swollen.   Urinary incontinence. Laughs or coughs will urinate/leak.  See list of problems. Numerous.  Past Medical History  Diagnosis Date  . Paranoid schizophrenia   . 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 Glaucoma   . Anemia   . Anxiety   . COPD (chronic obstructive pulmonary disease)   . Arthritis   . Blood transfusion without reported diagnosis   . CHF (congestive heart failure)   . Depression   . Seizures   . Stroke   . Substance abuse   . GERD (gastroesophageal reflux disease) 03/28/2015  . Essential hypertension 03/28/2015  . Thyroid disease   . Emphysema of lung   . Chronic renal failure 03/28/2015    History   Social History  . Marital Status: Divorced    Spouse Name: N/A  . Number of Children: N/A  . Years of Education: N/A   Occupational History  . Not on file.   Social History Main Topics  . Smoking status: Current Every Day Smoker  . Smokeless tobacco: Never Used  . Alcohol Use: No  . Drug Use: Not on file  . Sexual Activity: Yes   Other Topics Concern  . Not on file   Social History Narrative    Past Surgical History  Procedure Laterality Date  . Abdominal hysterectomy    . Bladder surgery      Family History  Problem Relation Age of Onset  . Diabetes  Mother   . Heart disease Mother   . Cancer Father     No Known Allergies  Current Outpatient Prescriptions on File Prior to Visit  Medication Sig Dispense Refill  . albuterol (PROVENTIL HFA;VENTOLIN HFA) 108 (90 BASE) MCG/ACT inhaler Inhale 2 puffs into the lungs every 4 (four) hours as needed for shortness of breath. May take every 4-6 hours prn    . allopurinol (ZYLOPRIM) 100 MG tablet Take 100 mg by mouth daily. 2 tabs orally once a day    . benztropine (COGENTIN) 0.5 MG tablet Take 0.5 mg by mouth 2 (two) times daily.    . colchicine 0.6 MG tablet Take 0.6 mg by mouth 2 (two) times daily.    Marland Kitchen esomeprazole (NEXIUM) 40 MG capsule Take 40 mg by mouth daily at 12 noon. Take in the morning    . fluPHENAZine decanoate (PROLIXIN) 25 MG/ML injection Inject 25 mg into the muscle every 14 (fourteen) days.    . Fluticasone-Salmeterol (ADVAIR) 250-50 MCG/DOSE AEPB Inhale 1 puff into the lungs 2 (two) times daily.    Marland Kitchen gabapentin (NEURONTIN) 600 MG tablet Take 600 mg by mouth 3 (three) times daily.    . Multiple Vitamin (MULTIVITAMIN WITH MINERALS) TABS tablet Take 1 tablet  by mouth daily.    Marland Kitchen oxcarbazepine (TRILEPTAL) 600 MG tablet Take 600 mg by mouth 2 (two) times daily.    Marland Kitchen oxybutynin (DITROPAN-XL) 5 MG 24 hr tablet Take 5 mg by mouth at bedtime.    Marland Kitchen oxyCODONE-acetaminophen (PERCOCET/ROXICET) 5-325 MG per tablet Take 1 tablet by mouth every 8 (eight) hours as needed for severe pain.    Marland Kitchen risperiDONE (RISPERDAL) 1 MG tablet Take 1 mg by mouth at bedtime. Take 1 and 1/2 tabs at bedtime    . sertraline (ZOLOFT) 100 MG tablet Take 100 mg by mouth daily.    Marland Kitchen tiotropium (SPIRIVA) 18 MCG inhalation capsule Place 18 mcg into inhaler and inhale daily.    . traZODone (DESYREL) 50 MG tablet Take 50 mg by mouth at bedtime.    . fentaNYL (DURAGESIC - DOSED MCG/HR) 25 MCG/HR patch Place 25 mcg onto the skin every 3 (three) days.     No current facility-administered medications on file prior to visit.     BP 94/69 mmHg  Pulse 87  Temp(Src) 98.5 F (36.9 C) (Axillary)  Ht 5' 6.5" (1.689 m)  Wt 177 lb 3.2 oz (80.377 kg)  BMI 28.18 kg/m2  SpO2 99%   Review of Systems  Constitutional: Negative for fever, chills, diaphoresis, activity change and fatigue.  Respiratory: Negative for cough, chest tightness and shortness of breath.   Cardiovascular: Negative for chest pain, palpitations and leg swelling.  Gastrointestinal: Negative for nausea, vomiting and abdominal pain.  Genitourinary: Positive for urgency and frequency.       Incontinence daily for years.  Musculoskeletal: Negative for neck pain and neck stiffness.       Rt hand and ankle pain.  Neurological: Negative for dizziness, tremors, seizures, syncope, facial asymmetry, speech difficulty, weakness, light-headedness, numbness and headaches.       Chronic rt upper ext weakness. Not new.Prior stroke.  Psychiatric/Behavioral: Negative for behavioral problems, confusion and agitation. The patient is not nervous/anxious.        Objective:   Physical Exam  General Mental Status- Alert. General Appearance- Not in acute distress.   Skin General: Color- Normal Color. Moisture- Normal Moisture.  Neck Carotid Arteries- Normal color. Moisture- Normal Moisture. No carotid bruits. No JVD.  Chest and Lung Exam Auscultation: Breath Sounds:-Normal.  Cardiovascular Auscultation:Rythm- Regular. Murmurs & Other Heart Sounds:Auscultation of the heart reveals- No Murmurs.  Abdomen Inspection:-Inspeection Normal. Palpation/Percussion:Note:No mass. Palpation and Percussion of the abdomen reveal- Non Tender, Non Distended + BS, no rebound or guarding.  Back- no cva tenderness  Rt hand- mild swollen and 3rd digit moderate swollen.   Rt ankle- very swollen medial aspect but no warmth and no tendereness to palpation.   Neurologic Cranial Nerve exam:- CN III-XII intact(No nystagmus), symmetric smile. Drift Test:- No  drift. Romberg Exam:- Negative.  Heal to Toe Gait exam:-Normal. Finger to Nose:- Normal/Intact Strength:- 5/5 equal and symmetric strength both upper and lower extremities. Except rue strenght 3/5.      Assessment & Plan:

## 2015-03-28 NOTE — Patient Instructions (Addendum)
Essential hypertension Hx of but last two years low bp and son states she is no longer on any bp meds.   CHF (congestive heart failure) Appears stable clinically today.   GERD (gastroesophageal reflux disease) Hx of but no acute complaint on first visit.   Sepsis due to urinary tract infection Recent dx in hospital. Will get urine culture today. If recurrent symptoms in near future and culture negative refer to urologist for IC evaluation.   Recurrent UTI 9 hosptialilzation with symptoms. Most recent culture was negative per hospital note review.   Low magnesium levels Check mg level today.   Hypokalemia Check k level today.   Right hand pain Minimal faint pain early on after fall. None now. Maybe fx that pain meds area covering up. Xray today.   Pain in joint, ankle and foot Rt medial ankle pain early on after fall but now none.No warmth. No pain on palpation. Get xray rule out fx.    Urinary, incontinence, stress female Daily and for years. Pt is on ditropan. So I will go ahead and refer to urologsit.   Follow up in 2 weeks or as needed  Note started to see pt for 1st time commented on approximate 10/20 of her problems will go into more depth. Needed to review recent hosptialization and handle acute complaints as wel.

## 2015-03-28 NOTE — Assessment & Plan Note (Signed)
By discussion she does see specialist. Appears stable and will continue to see psychiatrist in near future.

## 2015-03-28 NOTE — Telephone Encounter (Signed)
Refer to ortho for fx of ankle.

## 2015-03-28 NOTE — Assessment & Plan Note (Signed)
Recent dx in hospital. Will get urine culture today. If recurrent symptoms in near future and culture negative refer to urologist for IC evaluation.

## 2015-03-28 NOTE — Assessment & Plan Note (Signed)
Appears stable clinically today.

## 2015-03-28 NOTE — Assessment & Plan Note (Signed)
Hx of but no acute complaint on first visit.

## 2015-03-29 LAB — POCT URINALYSIS DIPSTICK
BILIRUBIN UA: NEGATIVE
Glucose, UA: NEGATIVE
Ketones, UA: NEGATIVE
NITRITE UA: NEGATIVE
RBC UA: NEGATIVE
SPEC GRAV UA: 1.02
Urobilinogen, UA: 4
pH, UA: 6

## 2015-03-29 NOTE — Addendum Note (Signed)
Addended by: Modena Morrow D on: 03/29/2015 11:28 AM   Modules accepted: Orders

## 2015-03-30 ENCOUNTER — Inpatient Hospital Stay
Admit: 2015-03-30 | Disposition: A | Discharge status: Hospice | Payer: Self-pay | Attending: Internal Medicine | Admitting: Internal Medicine

## 2015-03-30 LAB — COMPREHENSIVE METABOLIC PANEL
ANION GAP: 9 (ref 7–16)
AST: 20 U/L
Albumin: 3.7 g/dL
Alkaline Phosphatase: 95 U/L
BUN: 20 mg/dL
Bilirubin,Total: 0.4 mg/dL
CALCIUM: 10 mg/dL
CHLORIDE: 104 mmol/L
Co2: 27 mmol/L
Creatinine: 1.06 mg/dL — ABNORMAL HIGH
EGFR (African American): >60
GFR CALC NON AF AMER: 55 — AB
GLUCOSE: 115 mg/dL — AB
POTASSIUM: 3.2 mmol/L — AB
SGPT (ALT): 16 U/L
Sodium: 140 mmol/L
Total Protein: 6.6 g/dL

## 2015-03-30 LAB — URINALYSIS, COMPLETE
BILIRUBIN, UR: NEGATIVE
BLOOD: NEGATIVE
Bacteria: NONE SEEN
Glucose,UR: NEGATIVE mg/dL (ref 0–75)
Ketone: NEGATIVE
LEUKOCYTE ESTERASE: NEGATIVE
Nitrite: NEGATIVE
PROTEIN: NEGATIVE
Ph: 7 (ref 4.5–8.0)
SQUAMOUS EPITHELIAL: NONE SEEN
Specific Gravity: 1.006 (ref 1.003–1.030)

## 2015-03-30 LAB — CBC WITH DIFFERENTIAL/PLATELET
BASOS ABS: 0 10*3/uL (ref 0.0–0.1)
Basophil %: 0.8 %
EOS ABS: 0.3 10*3/uL (ref 0.0–0.7)
Eosinophil %: 5.7 %
HCT: 33.6 % — AB (ref 35.0–47.0)
HGB: 11.1 g/dL — ABNORMAL LOW (ref 12.0–16.0)
LYMPHS ABS: 0.5 10*3/uL — AB (ref 1.0–3.6)
Lymphocyte %: 10.4 %
MCH: 30.7 pg (ref 26.0–34.0)
MCHC: 33 g/dL (ref 32.0–36.0)
MCV: 93 fL (ref 80–100)
Monocyte #: 0.7 x10 3/mm (ref 0.2–0.9)
Monocyte %: 14.3 %
NEUTROS ABS: 3.4 10*3/uL (ref 1.4–6.5)
Neutrophil %: 68.8 %
Platelet: 239 10*3/uL (ref 150–440)
RBC: 3.61 10*6/uL — ABNORMAL LOW (ref 3.80–5.20)
RDW: 16.7 % — ABNORMAL HIGH (ref 11.5–14.5)
WBC: 4.9 10*3/uL (ref 3.6–11.0)

## 2015-03-30 LAB — TROPONIN I: Troponin-I: <0.03 ng/mL

## 2015-03-31 ENCOUNTER — Telehealth: Payer: Self-pay | Admitting: *Deleted

## 2015-03-31 LAB — BASIC METABOLIC PANEL
ANION GAP: 15 (ref 7–16)
BUN: 23 mg/dL — ABNORMAL HIGH
CALCIUM: 10.4 mg/dL — AB
CO2: 27 mmol/L
CREATININE: 1.07 mg/dL — AB
Chloride: 105 mmol/L
GFR CALC NON AF AMER: 55 — AB
Glucose: 131 mg/dL — ABNORMAL HIGH
Potassium: 3 mmol/L — ABNORMAL LOW
Sodium: 147 mmol/L — ABNORMAL HIGH

## 2015-03-31 LAB — CBC WITH DIFFERENTIAL/PLATELET
BASOS PCT: 0.8 %
Basophil #: 0 10*3/uL (ref 0.0–0.1)
EOS ABS: 0 10*3/uL (ref 0.0–0.7)
Eosinophil %: 0.2 %
HCT: 37.7 % (ref 35.0–47.0)
HGB: 12.6 g/dL (ref 12.0–16.0)
Lymphocyte #: 0.7 10*3/uL — ABNORMAL LOW (ref 1.0–3.6)
Lymphocyte %: 16.2 %
MCH: 30.9 pg (ref 26.0–34.0)
MCHC: 33.5 g/dL (ref 32.0–36.0)
MCV: 92 fL (ref 80–100)
Monocyte #: 0.4 x10 3/mm (ref 0.2–0.9)
Monocyte %: 9.2 %
NEUTROS PCT: 73.6 %
Neutrophil #: 3.1 10*3/uL (ref 1.4–6.5)
PLATELETS: 275 10*3/uL (ref 150–440)
RBC: 4.09 10*6/uL (ref 3.80–5.20)
RDW: 17 % — ABNORMAL HIGH (ref 11.5–14.5)
WBC: 4.3 10*3/uL (ref 3.6–11.0)

## 2015-03-31 NOTE — Telephone Encounter (Signed)
Medical records received via mail from [facility]. Forwarded to Karen/Edward. JG//CMA

## 2015-04-01 LAB — BASIC METABOLIC PANEL
Anion Gap: 14 (ref 7–16)
BUN: 49 mg/dL — AB
CREATININE: 1.71 mg/dL — AB
Calcium, Total: 10 mg/dL
Chloride: 105 mmol/L
Co2: 27 mmol/L
EGFR (African American): 36 — ABNORMAL LOW
EGFR (Non-African Amer.): 31 — ABNORMAL LOW
Glucose: 149 mg/dL — ABNORMAL HIGH
Potassium: 3.2 mmol/L — ABNORMAL LOW
Sodium: 146 mmol/L — ABNORMAL HIGH

## 2015-04-01 NOTE — Discharge Summary (Signed)
PATIENT NAME:  Veronica [name], Veronica [name] MR#:  [id number] DATE OF BIRTH:  Jan 07, 1951  DATE OF ADMISSION:  01/27/2013 DATE OF DISCHARGE:  01/30/2013  DISCHARGE DIAGNOSES: 1.  Gallstone, status post cholecystectomy.  2.  Severe dehydration with hypotension.  3.  Hypokalemia.  4.  Hypomagnesemia.   IMAGING STUDIES DONE:  Include a chest x-ray which showed some basilar atelectasis.   CONSULTATIONS:  Dr. Phylis Bougie of surgery and Dr. Weber Cooks of psychiatry.  ADMITTING HISTORY, PHYSICAL AND HOSPITAL COURSE:  Please see detailed history and physical dictated previously.  In brief, a 64 year old female patient was sent to the Emergency Room by Dr. Phylis Bougie for detox.  The patient was found to be hypotensive with severe dehydration, hypokalemic and was admitted to the hospitalist service for further work-up and treatment.  The patient was aggressively rehydrated.  Her culture is positive.  Her blood pressure improved with rehydration.  Her hypokalemia was replaced and started on oral replacement which she was not taking at home.  Dr. Phylis Bougie of surgery was consulted for cholecystectomy which patient had uneventfully and is being discharged home.  The patient will follow up with alcohol rehab.  The patient was seen by Dr. Weber Cooks of psychiatry during the hospital stay.   On the day of discharge, the patient's temperature is 97.4, pulse 89, blood pressure 114/79, saturating 95% on room air and was discharged home in a fair condition to follow up with Dr. Phylis Bougie of surgery for follow-up in 1 to 2 weeks.   DISCHARGE MEDICATIONS: 1.  Sertraline 100 mg oral once a day.  2.  Oxcarbazepine 600 mg oral once a day.  3.  Gabapentin 600 mg oral 2 times a day.  4.  Risperidone 1 mg oral once a day.  5.  ProAir HFA 2 puffs inhaled every 4 hours as needed.  6.  Spiriva 18 mcg inhaled once a day.  7.  Potassium chloride 20 mEq oral once a day.  8.  Dicyclomine 10 mg oral 3 times a day.  9.  Meloxicam 7.5 mg oral 2 times a day.   10.  Trazodone 50 mg oral once a day.  11.  Amlodipine benazepril 5/40 oral once a day.  12.  Protonix 40 mg oral once a day.  13.  Benztropine 1 mg 1/2 tablet oral 2 times a day.  14.  Multivitamin 1 tablet oral once a day.  15.  Diazepam 2 mg oral 2 times a day for three days.  16.  Prednisone 60 mg tapered over five days.  17.  Acetaminophen hydrocodone 300/5 1 tablet oral every 4 hours as needed, given pills for five days.  18.  Chlorthalidone held secondary to hypokalemia, hypotension.   DISCHARGE INSTRUCTIONS:  The patient has been counseled extensively to stay away from any alcohol.  Follow up with Dr. Phylis Bougie in 1 to 2 weeks.  Low-sodium, low-fat diet.  Activity as tolerated.  Follow up with primary care physician in one week.   TIME SPENT ON DAY OF DISCHARGE:  Was 40 minutes.      ____________________________ Leia Alf Olsen Mccutchan, MD srs:ea D: 01/31/2013 13:11:55 ET T: 02/01/2013 03:12:33 ET JOB#: [id number]  cc: Alveta Heimlich R. Jalonda Antigua, MD, <Dictator> Neita Carp MD ELECTRONICALLY SIGNED 02/07/2013 15:23

## 2015-04-01 NOTE — Consult Note (Signed)
Brief Consult Note: Diagnosis: Alcohol dependence.   Patient was seen by consultant.   Consult note dictated.   Recommend further assessment or treatment.   Comments: Psychiatry: Patient seen in consult done. Patient is not currently having acute symptoms of alcohol withdrawal but she did come into the hospital requesting detox. He would probably be ideal for her to come to the psychiatry ward to make sure that she is stabilized particularly since she has community support services and other complicating mental health problems. He she is medically stable I will be happy to admit her tomorrow if she is agreeable or her she is under involuntary commitment. We will followup.  Electronic Signatures: Clapacs, Madie Reno (MD)  (Signed 19-Feb-14 23:50)  Authored: Brief Consult Note   Last Updated: 19-Feb-14 23:50 by Gonzella Lex (MD)

## 2015-04-01 NOTE — Consult Note (Signed)
PATIENT NAME:  Veronica, [name] MR#:  [id number] DATE OF BIRTH:  1951/01/23  DATE OF CONSULTATION:  01/28/2013  REFERRING PHYSICIAN:   CONSULTING PHYSICIAN:  Gonzella Lex, MD  IDENTIFYING INFORMATION, CHIEF COMPLAINT AND REASON FOR CONSULTATION:  A 64 year old woman with a history of alcohol dependence and schizophrenia who was admitted to the medical service because of hypokalemia and hypotension.  Consult is for further alcohol detox treatment.   HISTORY OF PRESENT ILLNESS:  The patient presented to the Emergency Room on the 18th saying that she was requesting detox from alcohol.  She had been drinking again recently.  The exact amount is unclear.  She gives variable amounts, but sounds like she is probably using somewhere in the neighborhood of 1/2 pint to a pint of liquor a day.  Her physician had recommended to her that she have a cholecystectomy, but did not want to do it until she got detoxed from alcohol so recommended she come to the hospital.  The patient is denying currently to me that she has been having hallucinations or delusions.  Says that she has been compliant with her medicine.  Denies depression.  Denies suicidal ideation.  She was found in the Emergency Room when she presented to have a low blood pressure and also to have a potassium of 2.5, which was why she was admitted to the medical service.  On interview today the patient tells me that she is not feeling shaky.  She has not had a seizure.  She says that her mood is feeling good.  She says to me that she would like to really stop drinking.  She admits that she did not stop after the last time she was in the hospital for detox, but thinks that this time it would really change because she is motivated by her medical problems.   PAST PSYCHIATRIC HISTORY:  The patient has a long-standing history of alcohol abuse and schizophrenia.  She is followed by the ACT team with [facility].  She does not report any history of suicidal  behavior.  From past notes it appears that she is generally very compliant with her psychiatric medication and cooperative with the ACT team.  Her last psychiatric hospitalization here was primarily for alcohol detox.  The patient denies having had seizures or DTs in the past.  She says she has been able to stay sober, but recently has not been making enough of an effort.  She has been to the alcohol and drug abuse treatment center in the past.   PAST MEDICAL HISTORY:  History of COPD, history of hypokalemia it appears.   CURRENT MEDICATIONS:  Sertraline 100 mg per day, chlorthalidone 25 mg in the morning, oxcarbazepine 600 mg once a day, gabapentin 600 mg twice a day, risperidone 1.5 mg at night, benztropine 1 mg twice a day, Pro Air inhaler 2 puffs every 4 to 6 hours as needed, Spiriva inhaled capsule each day in the morning, 20 mEq of Klor-Con in the morning plus another 10 mEq.  Dicyclomine 10 mg 3 times a day, meloxicam 7.5 mg twice a day, trazodone 50 mg at night and Prolixin Decanoate 25 mg injected every three weeks.   ALLERGIES:  No known drug allergies.   SOCIAL HISTORY:  The patient apparently lives alone.  She does have the support of the ACT team.   REVIEW OF SYSTEMS:  Currently she says just that she feels very hungry.  Denies feeling shaky.  Denies auditory or visual hallucinations.  Denies suicidal or homicidal ideation.  Does state that she wants to stop drinking.   MENTAL STATUS EXAMINATION:  The patient was interviewed in her hospital room.  She was wide awake when I came into the room and was cooperative with the interview.  Eye contact was good.  Psychomotor activity normal.  Speech normal in rate, tone and volume.  Affect is slightly constricted, but euthymic.  Mood is stated as okay.  Thoughts appear a little bit concrete and simple, but did not make any grossly bizarre or delusional statements.  Denies current hallucinations.  Denies suicidal or homicidal ideation.  Seems to  have fairly intact judgment and insight, probably average intelligence.   CURRENT VITAL SIGNS:  Today she had a pulse as high as 103, but her blood pressure was low at that point.  Blood pressure is now up to 120/78, pulse 73, temperature 97.4.   LABORATORY RESULTS:  She is now showing a potassium up to 3.6 today.   ASSESSMENT:  A 64 year old woman with schizophrenia and alcohol dependence.  She was meant to be admitted to psychiatry, but because of medical problems was admitted to the medical unit.  At this point she does not seem to be having symptoms of alcohol withdrawal or requiring significant amounts of detox medicine.  Nevertheless, I am fine with admitting her to psychiatry if she is already under involuntary commitment.  She is telling me that she would like to go home.  I will talk to her tomorrow and try and talk her into voluntarily coming to psychiatry with the rationale that we will complete the stabilization of her and get in touch with the ACT team so that we can facilitate what might be real long-term sobriety.  We can also find out if the ACT team wants her to go back to the alcohol and drug abuse treatment center first.  If she refuses and is not under commitment, we may have to let her go home, but at least we will have the opportunity to talk to the ACT team.  I will follow up with her in the morning.   DIAGNOSIS PRINCIPAL AND PRIMARY:  AXIS I:  Alcohol dependence.   SECONDARY DIAGNOSES: AXIS I:  Schizophrenia, undifferentiated.  AXIS II:  Deferred.  AXIS III:  Hypokalemia, hypotension.  AXIS IV:  Moderate to severe from chronic burden of illness and substance abuse.  AXIS V:  Functioning at time of evaluation 45.        ____________________________ Gonzella Lex, MD jtc:ea D: 01/28/2013 23:48:44 ET T: 01/29/2013 00:15:52 ET JOB#: [id number]  cc: Gonzella Lex, MD, <Dictator> Gonzella Lex MD ELECTRONICALLY SIGNED 01/30/2013 9:24

## 2015-04-01 NOTE — H&P (Signed)
PATIENT NAME:  Veronica [name], Veronica [name] MR#:  [id number] DATE OF BIRTH:  29-May-1951  DATE OF ADMISSION:  01/27/2013  ADMITTING PHYSICIAN:  Gladstone Lighter, MD  PRIMARY CARE PHYSICIAN: None  REASON FOR ADMISSION:  Hypokalemia and alcohol detox.   HISTORY OF PRESENT ILLNESS: The patient is a 64 year old African American female with past medical history significant for depression with psychosis, hypertension and schizophrenia,  tobacco use disorder and asthma, who has been seeing Dr. Phylis Bougie at [facility] for postprandial right upper quadrant abdominal pain, nausea and vomiting. He recommended cholecystectomy; however,  because she has history of alcohol abuse, he recommended detox prior to the surgery.  The patient actually presented to the ER to get detox. She was supposed to be admitted to the behavioral medicine unit; however, she was hypotensive with blood pressure of 80/50 when she came in and also hypokalemic with potassium of 2.5. So, she is being admitted to the medical service for her hypokalemia and hypotension prior to being transferred to behavioral medicine unit for alcohol detox.   PAST MEDICAL HISTORY: 1.  Schizophrenia and schizoaffective disorder with psychosis.  2.  Hypertension.  3.  Asthma.  4.  Alcohol abuse.  5.  Tobacco use disorder.  6.  Marijuana abuse.  7. Questionable history of congestive heart failure from the ER physician notes; however, the patient denies it and none seen in her previous admissions.   PAST SURGICAL HISTORY: Hysterectomy.   ALLERGIES TO MEDICATIONS: No known drug allergies.   CURRENT HOME MEDICATIONS:  1. Amlodipine-benazepril 5/40mg  1 capsule p.o. daily.  2.  Benztropine 0.5 mg p.o. b.i.d.  3.  Chlorthalidone 25 mg p.o. daily.  4.  Dicyclomine 10 mg p.o. 3 times daily. 5.  Gabapentin 600 mg p.o. b.i.d.  6.  Potassium chloride 30 mEq p.o. daily.  7.  Meloxicam 7.5 mg p.o. b.i.d.  8.  Multivitamin 1 tablet p.o. daily.  9.   Oxcarbazepine at 600 mg p.o. daily.  10.  Protonix 40 mg p.o. daily.  11.  ProAir inhaler 2 puffs q. 4 to 6 hours p.r.n.  12.  Risperidone 1.5 mg at bedtime.  13.  Sertraline 100 mg p.o. daily.  14.  Spiriva inhaler daily.  15.  Trazodone 50 mg at bedtime.   SOCIAL HISTORY: The patient states she lives at home by herself. She continues to smoke about 1/2 pack per day and drinks about 2 to 3 shots either beer or vodka or gin every day. She also uses some marijuana and last drinking and use of marijuana was yesterday.   FAMILY HISTORY: Hypertension and heart disease runs in the family.   REVIEW OF SYSTEMS: CONSTITUTIONAL: No fever, fatigue or weakness.  EYES: No blurred vision, double vision, inflammation or glaucoma.  ENT: No tinnitus, ear pain, epistaxis or hearing loss.  RESPIRATORY: Positive for cough and asthma and also mild wheeze.  CARDIOVASCULAR: No chest pain, orthopnea, edema, palpitations or syncope.  GASTROINTESTINAL: She does have some nausea. No vomiting, diarrhea. Mild upper quadrant discomfort. No hematemesis or melena.  GENITOURINARY: No dysuria, hematuria, renal calculus, frequency or incontinence.  ENDOCRINE: No polyuria, nocturia, thyroid problems, heat or cold intolerance.  HEMATOLOGY: No anemia, easy bruising or bleeding.  SKIN: No acne, rash or lesions.  MUSCULOSKELETAL: No neck, back, shoulder pain, arthritis or gout.  NEUROLOGIC: No numbness, weakness, CVA, TIA or seizures.  PSYCHOLOGIC: Positive for anxiety and depression. No insomnia.    PHYSICAL EXAMINATION: VITAL SIGNS: Temperature 98 degrees Fahrenheit, pulse 97, respirations 18,  blood pressure  80/60, pulse ox 100% on room air.  GENERAL: Well-built well-nourished female lying in bed, not in any acute distress.  HEENT: Normocephalic, atraumatic. Pupils equal, round, reacting to light. Muddy conjunctivae, anicteric sclerae. Extraocular movements intact. Oropharynx clear without erythema, mass or exudates.   NECK: Supple. No thyromegaly, JVD or carotid bruits. No lymphadenopathy.  LUNGS: Moving air bilaterally. Diffuse expiratory wheezes present. No use of accessory muscles for breathing. No crackles.  CARDIOVASCULAR: S1, S2 regular rate and rhythm. No murmurs, rubs, or gallops.  ABDOMEN: Soft, nontender, mild discomfort in right upper quadrant. No rebound, guarding or rigidity. Normal bowel sounds.  EXTREMITIES: No pedal edema. No clubbing or cyanosis. 2+ dorsalis pedis pulse is palpable bilaterally.  SKIN: No acne, rash or lesions.  LYMPHATIC: No cervical lymphadenopathy.  NEUROLOGIC: Cranial nerves intact. No focal motor or sensory deficits.  PSYCHOLOGICAL: The patient is awake, alert, oriented x 3.   LABORATORY, DIAGNOSTIC, AND RADIOLOGICAL DATA: Sodium 140, potassium 2.5, chloride 104, bicarbonate 27, BUN 20, creatinine 1.25, glucose 134 and calcium 9.6.   ALT 24, AST 31, alkaline phosphatase 126, total bilirubin 0.4, albumin 3.8. Alcohol level is negative. TSH 0.5 and lipase 95. Troponin is negative. Urinalysis negative for any infection. Urine tox screen positive for marijuana. WBC 7.3, hemoglobin 12.3, hematocrit 37.2, platelet count 188.   ASSESSMENT AND PLAN: This is a 64 year old female with history of alcohol abuse, smoking, schizophrenia, depression, chronic obstructive pulmonary disease, who was referred for cholecystectomy and comes to the hospital for inpatient detox prior to her surgery, but was found to be hypotensive and hypokalemic.  1.  Hypotension, likely from taking blood pressure medications at home and volume depletion. Hold her blood pressure medications and she received 1 liter of fluid in the ED. We will continue gentle IV fluids. Blood pressure is improving. Questionable history of congestive heart failure, which was not verified before, but we will check an echo. No infections identified.  2.  Acute asthma exacerbation with bronchitis. Solu-Medrol IV with Spiriva, DuoNebs  and Levaquin for her bronchitis symptoms. 3.  Hypokalemia.  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 We will hold her diuretic and we will be replace here and recheck.   4.  Tobacco use disorder. Counseled for 3 minutes and placing on nicotine patch.  5.  Alcohol and marijuana abuse.  We will get psychiatric consult for inpatient detox after she is medically stable. She will start routine protocol.   5.  Depression, psychosis history with schizophrenia. Continue home medications   CODE STATUS: Full code.   TIME SPENT ON ADMISSION: 50 minutes   ____________________________ Gladstone Lighter, MD rk:cc D: 01/27/2013 19:28:45 ET T: 01/27/2013 20:16:52 ET JOB#: [id number]  cc: Gladstone Lighter, MD, <Dictator> Gladstone Lighter MD ELECTRONICALLY SIGNED 02/04/2013 16:01

## 2015-04-01 NOTE — Consult Note (Signed)
Chief Complaint:  Subjective/Chief Complaint pt is being seen for cholysystectomy   VITAL SIGNS/ANCILLARY NOTES: **Vital Signs.:   20-Feb-14 00:30  Vital Signs Type Q 4hr  Temperature Temperature (F) 97.4  Celsius 36.3  Temperature Source oral  Pulse Pulse 80  Respirations Respirations 20  Systolic BP Systolic BP 929  Diastolic BP (mmHg) Diastolic BP (mmHg) 78  Mean BP 90  Pulse Ox % Pulse Ox % 95  Pulse Ox Activity Level  At rest  Oxygen Delivery Room Air/ 21 %    03:42  Vital Signs Type Routine  Temperature Temperature (F) 97.7  Celsius 36.5  Temperature Source oral  Pulse Pulse 84  Respirations Respirations 20  Systolic BP Systolic BP 244  Diastolic BP (mmHg) Diastolic BP (mmHg) 87  Mean BP 97  Pulse Ox % Pulse Ox % 95  Pulse Ox Activity Level  At rest  Oxygen Delivery Room Air/ 21 %    07:10  Telemetry pattern Cardiac Rhythm Normal sinus rhythm; pattern reported by Telemetry Clerk; 86    08:18  Temperature Temperature (F) 97.4  Celsius 36.3  Temperature Source oral  Pulse Pulse 90  Respirations Respirations 18  Systolic BP Systolic BP 628  Diastolic BP (mmHg) Diastolic BP (mmHg) 84  Mean BP 94  Pulse Ox % Pulse Ox % 95  Pulse Ox Activity Level  At rest  Oxygen Delivery Room Air/ 21 %    10:31  Telemetry pattern Cardiac Rhythm Normal sinus rhythm  *Intake and Output.:   20-Feb-14 01:31  Grand Totals Intake:   Output:      Net:   24 Hr.:  480  Urinary Method  Void; Up to BR    Daily 07:00  Grand Totals Intake:  480 Output:      Net:  480 24 Hr.:  480  Oral Intake      In:  480  Length of Stay Totals Intake:  6381 Output:      Net:  7711    08:25  Grand Totals Intake:  120 Output:      Net:  120 24 Hr.:  120  Oral Intake      In:  120  Percentage of Meal Eaten  100    Shift 15:00  Grand Totals Intake:  120 Output:      Net:  120 24 Hr.:  120  Oral Intake      In:  120  Length of Stay Totals Intake:  2635 Output:      Net:  2635   Brief  Assessment:  Cardiac Regular   Respiratory normal resp effort   Gastrointestinal Normal   Gastrointestinal details normal Soft  Nontender   Lab Results: Thyroid:  18-Feb-14 16:31   Thyroid Stimulating Hormone 0.51 (0.45-4.50 (International Unit)  ----------------------- Pregnant patients have  different reference  ranges for TSH:  - - - - - - - - - -  Pregnant, first trimetser:  0.36 - 2.50 uIU/mL)  Hepatic:  18-Feb-14 16:31   Bilirubin, Total 0.4  Alkaline Phosphatase 126  SGPT (ALT) 24  SGOT (AST) 31  Total Protein, Serum  8.4  Albumin, Serum 3.8  Routine Micro:  18-Feb-14 21:01   Micro Text Report BLOOD CULTURE   COMMENT                   NO GROWTH IN 36 HOURS   ANTIBIOTIC  Culture Comment NO GROWTH IN 36 HOURS  Result(s) reported on 29 Jan 2013 at 06:41AM.    21:10   Micro Text Report BLOOD CULTURE   COMMENT                   NO GROWTH IN 36 HOURS   ANTIBIOTIC                       Culture Comment NO GROWTH IN 54 HOURS  Result(s) reported on 29 Jan 2013 at 06:41AM.  Cardiology:  19-Feb-14 10:34   Echo Doppler  Interpretation Summary   Left ventricular systolic function is normal.ef 65% There is trace  mitral regurgitation.   PatientHeight: 175 cm   PatientWeight: 86 kg   BSA: 2.0 m2  Procedure:   The study was done portably.   A two-dimensional transthoracic echocardiogram with color flow and  Doppler was performed.  Left Ventricle   The left ventricle is normal in size.   Left ventricular systolic function is normal.  Right Ventricle   The right ventricle is normal in size and function.  Atria   The left atrial size is normal.  Mitral Valve   The mitral valve leaflets appear thickened, but open well.   There is trace mitral regurgitation.  Tricuspid Valve   No tricuspid regurgitation.   The tricuspid valve is normal in structure and function.  Aortic Valve   The aortic valve is normal in structure and  function.   No aortic regurgitation is present.  Pulmonic Valve   The pulmonic valve is not well seen, but is grossly normal.  Great Vessels   The aortic root is normal size.  Pericardium/Pleural   No pericardial effusion.  MMode 2D Measurements and Calculations   RVDd: 3.1 cm   IVSd: 1.5 cm   LVIDd: 4.2 cm   LVIDs: 2.4 cm   LVPWd: 1.5 cm   FS: 43 %   EF(Teich): 74 %   Ao root diam: 3.0 cm   LA dimension: 4.4 cm   LVOT diam: 2.1 cm  Doppler Measurements and Calculations   MV E point: 77 cm/sec   MV A point: 127 cm/sec   MV E/A: 0.61    MV dec time: 0.21 sec   Ao V2 max: 121 cm/sec   Ao max PG: 6.0 mmHg   AVA(V,D): 3.3 cm2   LV max PG: 5.0 mmHg   LV V1 max: 115 cm/sec   PA V2 max: 128 cm/sec   PA max PG: 7.0 mmHg   TR Max vel: 279 cm/sec   TR Max PG: 31 mmHg   RVSP: 36 mmHg   RAP systole: 5.0 mmHg  Reading Physician: Serafina Royals  Sonographer: Sherrie Sport Interpreting Physician:  Serafina Royals,  electronically signed on  01-28-2013 13:52:47 Requesting Physician: Serafina Royals  Routine Chem:  18-Feb-14 16:31   Glucose, Serum  134  BUN  20  Creatinine (comp) 1.25  Sodium, Serum 140  Potassium, Serum  2.5  Chloride, Serum 104  CO2, Serum 27  Calcium (Total), Serum 9.6  Anion Gap 9  Osmolality (calc) 284  eGFR (African American)  54  eGFR (Non-African American)  46 (eGFR values <82mL/min/1.73 m2 may be an indication of chronic kidney disease (CKD). Calculated eGFR is useful in patients with stable renal function. The eGFR calculation will not be reliable in acutely ill patients when serum creatinine is changing rapidly. It is not useful in  patients on dialysis. The  eGFR calculation may not be applicable to patients at the low and high extremes of body sizes, pregnant women, and vegetarians.)  Magnesium, Serum  1.6 (1.8-2.4 THERAPEUTIC RANGE: 4-7 mg/dL TOXIC: > 10 mg/dL  -----------------------)  B-Type Natriuretic Peptide [facility]) 75 (Result(s)  reported on 27 Jan 2013 at 05:53PM.)  Result Comment PT/PTT - no blue top sent to lab  - called ER 2x, RN did not ans  Result(s) reported on 27 Jan 2013 at 05:41PM.  Result Comment potassium - RESULTS VERIFIED BY REPEAT TESTING.  - NOTIFIED OF CRITICAL VALUE  - c/Tina Robina Ade $RemoveBe'@17'[id number]$ :18 01-27-13 by [facility]  - READ-BACK PROCESS PERFORMED.  Result(s) reported on 27 Jan 2013 at 05:09PM.  Lipase 95 (Result(s) reported on 27 Jan 2013 at 05:53PM.)  Ethanol, S. < 3  Ethanol % (comp) < 0.003 (Result(s) reported on 27 Jan 2013 at 05:09PM.)  19-Feb-14 05:18   Glucose, Serum  158  BUN 16  Creatinine (comp) 0.99  Sodium, Serum 141  Potassium, Serum 3.6  Chloride, Serum  108  CO2, Serum 24  Calcium (Total), Serum 9.3  Anion Gap 9  Osmolality (calc) 286  eGFR (African American) >60  eGFR (Non-African American) >60 (eGFR values <64mL/min/1.73 m2 may be an indication of chronic kidney disease (CKD). Calculated eGFR is useful in patients with stable renal function. The eGFR calculation will not be reliable in acutely ill patients when serum creatinine is changing rapidly. It is not useful in  patients on dialysis. The eGFR calculation may not be applicable to patients at the low and high extremes of body sizes, pregnant women, and vegetarians.)  Urine Drugs:  87-OMV-67 20:94   Tricyclic Antidepressant, Ur Qual (comp) NEGATIVE (Result(s) reported on 27 Jan 2013 at 06:16PM.)  Amphetamines, Urine Qual. NEGATIVE  MDMA, Urine Qual. NEGATIVE  Cocaine Metabolite, Urine Qual. NEGATIVE  Opiate, Urine qual NEGATIVE  Phencyclidine, Urine Qual. NEGATIVE  Cannabinoid, Urine Qual. POSITIVE  Barbiturates, Urine Qual. NEGATIVE  Benzodiazepine, Urine Qual. NEGATIVE (----------------- The URINE DRUG SCREEN provides only a preliminary, unconfirmed analytical test result and should not be used for non-medical  purposes.  Clinical consideration and professional judgment should be  applied to any positive drug screen  result due to possible interfering substances.  A more specific alternate chemical method must be used in order to obtain a confirmed analytical result.  Gas chromatography/mass spectrometry (GC/MS) is the preferred confirmatory method.)  Methadone, Urine Qual. NEGATIVE  Cardiac:  18-Feb-14 16:31   CK, Total 164  CPK-MB, Serum 1.5 (Result(s) reported on 27 Jan 2013 at 05:53PM.)  Troponin I < 0.02 (0.00-0.05 0.05 ng/mL or less: NEGATIVE  Repeat testing in 3-6 hrs  if clinically indicated. >0.05 ng/mL: POTENTIAL  MYOCARDIAL INJURY. Repeat  testing in 3-6 hrs if  clinically indicated. NOTE: An increase or decrease  of 30% or more on serial  testing suggests a  clinically important change)  Routine UA:  18-Feb-14 15:54   Color (UA) Yellow  Clarity (UA) Hazy  Glucose (UA) Negative  Bilirubin (UA) Negative  Ketones (UA) Negative  Specific Gravity (UA) 1.020  Blood (UA) Negative  pH (UA) 6.0  Protein (UA) Negative  Nitrite (UA) Negative  Leukocyte Esterase (UA) Negative (Result(s) reported on 27 Jan 2013 at 06:21PM.)  RBC (UA) 1 /HPF  WBC (UA) 1 /HPF  Bacteria (UA) NONE SEEN  Epithelial Cells (UA) 3 /HPF  Mucous (UA) PRESENT  Hyaline Cast (UA) 6 /LPF (Result(s) reported on 27 Jan 2013 at 06:21PM.)  Routine Coag:  18-Feb-14  16:31   Prothrombin -  INR - (INR reference interval applies to patients on anticoagulant therapy. A single INR therapeutic range for coumarins is not optimal for all indications; however, the suggested range for most indications is 2.0 - 3.0. Exceptions to the INR Reference Range may include: Prosthetic heart valves, acute myocardial infarction, prevention of myocardial infarction, and combinations of aspirin and anticoagulant. The need for a higher or lower target INR must be assessed individually. Reference: The Pharmacology and Management of the Vitamin K  antagonists: the seventh ACCP Conference on Antithrombotic and Thrombolytic Therapy.  Chest.2004 Sept:126 (3suppl): L7870634. A HCT value >55% may artifactually increase the PT.  In one study,  the increase was an average of 25%. Reference:  "Effect on Routine and Special Coagulation Testing Values of Citrate Anticoagulant Adjustment in Patients with High HCT Values." American Journal of Clinical Pathology 2006;126:400-405.)  Activated PTT (APTT) - (A HCT value >55% may artifactually increase the APTT. In one study, the increase was an average of 19%. Reference: "Effect on Routine and Special Coagulation Testing Values of Citrate Anticoagulant Adjustment in Patients with High HCT Values." American Journal of Clinical Pathology 2006;126:400-405.)  Routine Hem:  18-Feb-14 16:31   WBC (CBC) 7.3  RBC (CBC) 4.02  Hemoglobin (CBC) 12.3  Hematocrit (CBC) 37.5  Platelet Count (CBC) 188 (Result(s) reported on 27 Jan 2013 at 04:57PM.)  MCV 93  MCH 30.7  MCHC 32.9  RDW  14.6  19-Feb-14 05:18   WBC (CBC) 6.7  RBC (CBC)  3.77  Hemoglobin (CBC)  11.7  Hematocrit (CBC) 35.3  Platelet Count (CBC) 184  MCV 94  MCH 31.0  MCHC 33.2  RDW  15.1  Neutrophil % 87.9  Lymphocyte % 8.3  Monocyte % 2.4  Eosinophil % 0.9  Basophil % 0.5  Neutrophil # 5.9  Lymphocyte #  0.6  Monocyte # 0.2  Eosinophil # 0.1  Basophil # 0.0 (Result(s) reported on 28 Jan 2013 at 06:10AM.)   Radiology Results: XRay:    18-Feb-14 17:30, Chest PA and Lateral  Chest PA and Lateral   REASON FOR EXAM:    low blood pressure  COMMENTS:   LMP: Post Hysterectomy    PROCEDURE: DXR - DXR CHEST PA (OR AP) AND LATERAL  - Jan 27 2013  5:30PM     RESULT: Comparison: 12/10/2011    Findings:     PA and lateral chest radiographs are provided. There is no focal   parenchymal opacity, pleural effusion, or pneumothorax. The heart and   mediastinum are unremarkable.  The osseous structures are unremarkable.    IMPRESSION:   No acute disease of the chest.    Dictation Site: 1        Verified By: Joellyn Haff, M.D., MD  Cardiology:    19-Feb-14 10:34, Echo Doppler  Echo Doppler   Interpretation Summary    Left ventricular systolic function is normal.ef 65% There is trace   mitral regurgitation.    PatientHeight: 175 cm    PatientWeight: 86 kg    BSA: 2.0 m2    Procedure:    The study was done portably.    A two-dimensional transthoracic echocardiogram with color flow and   Doppler was performed.    Left Ventricle    The left ventricle is normal in size.    Left ventricular systolic function is normal.    Right Ventricle    The right ventricle is normal in size and function.    Atria  The left atrial size is normal.    Mitral Valve    The mitral valve leaflets appear thickened, but open well.    There is trace mitral regurgitation.    Tricuspid Valve    No tricuspid regurgitation.    The tricuspid valve is normal in structure and function.    Aortic Valve    The aortic valve is normal in structure and function.    No aortic regurgitation is present.    Pulmonic Valve    The pulmonic valve is not well seen, but is grossly normal.    Great Vessels    The aortic root is normal size.    Pericardium/Pleural    No pericardial effusion.    MMode 2D Measurements and Calculations    RVDd: 3.1 cm    IVSd: 1.5 cm    LVIDd: 4.2 cm    LVIDs: 2.4 cm    LVPWd: 1.5 cm    FS: 43 %    EF(Teich): 74 %    Ao root diam: 3.0 cm    LA dimension: 4.4 cm    LVOT diam: 2.1 cm    Doppler Measurements and Calculations    MV E point: 77 cm/sec    MV A point: 127 cm/sec    MV E/A: 0.61     MV dec time: 0.21 sec    Ao V2 max: 121 cm/sec    Ao max PG: 6.0 mmHg    AVA(V,D): 3.3 cm2    LV max PG: 5.0 mmHg    LV V1 max: 115 cm/sec    PA V2 max: 128 cm/sec    PA max PG: 7.0 mmHg    TR Max vel: 279 cm/sec    TR Max PG: 31 mmHg    RVSP: 36 mmHg    RAP systole: 5.0 mmHg    Reading Physician: Serafina Royals   Sonographer: Sherrie Sport  Interpreting Physician:  Serafina Royals,   electronically signed on   01-28-2013 13:52:47  Requesting Physician: Serafina Royals   Assessment/Plan:  Invasive Device Daily Assessment of Necessity:  Does the patient currently have any of the following indwelling devices? none   Assessment/Plan:  Assessment pt needs cholysystectomy but could not be done because of alcohl and drug problems pt is noe detoxicated and ready for surgery   Plan lap choly tomorrow   Electronic Signatures: Vella Kohler (MD)  (Signed 20-Feb-14 13:45)  Authored: Chief Complaint, VITAL SIGNS/ANCILLARY NOTES, Brief Assessment, Lab Results, Radiology Results, Assessment/Plan   Last Updated: 20-Feb-14 13:45 by Vella Kohler (MD)

## 2015-04-01 NOTE — Op Note (Signed)
PATIENT NAME:  Veronica [name], Veronica [name] MR#:  [id number] DATE OF BIRTH:  10/06/1951  DATE OF PROCEDURE:    PREOPERATIVE DIAGNOSIS:  Acute cholecystitis.   POSTOPERATIVE DIAGNOSIS:  Acute cholecystitis.   PROCEDURE:  Laparoscopic cholecystectomy with cholangiogram.   SURGEON:  Trevyon Swor S. Phylis Bougie, M.D.  INDICATION FOR STUDY:  This patient was scheduled for laparoscopic cholecystectomy last week.  She is a chronic drinker and she also last time was canceled by anesthesiologist because of DTs and she was admitted in this hospital for detoxification later on.  The patient was in the hospital when the medical doctor called me too that we should go ahead and do the surgery.  The patient was then seen by me in consultation yesterday and she was tender in the right upper quadrant of the abdomen.  I explained to her and she also had a big scar below the umbilicus going to the symphysis pubis and because of previous surgeries I told her that we going to do a laparoscopic cholecystectomy.  I explained to her possible complications, naming bowel injury because of previous surgery, bleeding from the liver, etc., because she is a chronic drinker.    OPERATIVE FINDINGS:  1.  The patient had a lot of adhesions near the umbilical area due to omentum, had a difficult time entering into the abdomen, thus we did an open [place] insertion.  2.  Also had massive adhesions of the gallbladder to the liver and omentum to the liver.  It was plastered with the liver.  The gallbladder itself was very long and elongated, had a lot of adhesions.   PROCEDURE:  First of all after she was put to sleep, a small incision was made over the umbilicus.  After cutting skin, subcutaneous tissue, the fascia was cut.  The fascia was cut deep.  I picked up the peritoneum and opened it, had a hard time entering into the abdomen.  It looked like there were a lot of adhesions.  I was trying to make sure we do not injure the small bowel because everything  was stuck with very carefully.  With a little dissection I was able to enter the abdomen and then trocar was inserted.  After that, with a camera, I went to the right side and tried to see if there is any leakage of bowel and there was not.  I did not see any obvious injury.  Mostly it omentum and it was bleeding a little bit, but it was stopped.  After that, another trocar was put in the epigastric region,   The patient in the beginning when we went in had a lot of adhesions on the liver and liver surface.  First of all these adhesions are 1 x 1, taken off by the Bovie and also from the liver also with the Bovie and there was some bleeding, oozing from the omentum which was stopped.  The gallbladder was then capped and lifting up the gallbladder had a stone stuck at the [facility] pouch area.  The gallbladder was lifted up and then after I pushed the gallbladder and the stone up, cystic artery was then clipped and cut.  Cystic duct, it was kind of big cystic duct dissected off and I did a cholangiogram.  The cholangiogram was normal.  I did not see any stone in the common duct and it went into the duodenum normally.  I put three clips because the duct was big and it could not completely cover it, but finally  I put extra two clips which covered the duct completely.  There was no bile leak from there.  After this was done, the gallbladder was then lifted up from the liver bed and taken off from the liver bed and removed from the upper part of the abdomen from epigastric port.  After this irrigation of the abdomen was performed which showed there is no biliary leak or no blood leak in the belly and again I took the camera to the epigastric port and looked down where I entered into the abdomen to make sure there was no bowel injury and I did not see it.  After this was done, I closed the umbilical port with interrupted 0 Vicryl sutures and subcuticular suturing was applied to the rest of the sutures and staples applied.   The patient tolerated the procedure well, sent to recovery room in satisfactory condition.    ____________________________ Welford Roche Phylis Bougie, MD msh:ea D: 01/30/2013 16:24:27 ET T: 01/31/2013 06:16:44 ET JOB#: [id number]  cc: Presley Gora S. Phylis Bougie, MD, <Dictator> Sharene Butters MD ELECTRONICALLY SIGNED 02/02/2013 8:14

## 2015-04-01 NOTE — Consult Note (Signed)
Details:   - Psychiatry: Came by to followup on this patient who has chronic schizophrenia and alcohol dependence. She came to the hospital with the intention of having alcohol detox but was admitted to medical because of hypokalemia. At this point her electrolytes have stabilized. Patient's vital signs are stable. She is not requiring frequent doses of medication for alcohol withdrawal. On interview today the patient is awake and alert. She is oriented to place time and situation. She is able to tell me her address and tell me who sees her for outpatient treatment. She is agreeable to continuing getting outpatient psychiatric treatment. Her affect is a little bit flat but probably at her baseline. Not hostile or threatening. Not tearful. Denies any suicidal or homicidal ideation at all. Denies hallucinations and does not appear to be delirious or psychotic.  Patient was brought to the hospital but was not put under involuntary commitment. I suggested to the patient today that we transfer her voluntarily to the behavioral medicine service to finish with detox and then discharge her back to the plan that the ACT team has. Patient understood my suggestion but she declined the offer. She says that she refuses to go downstairs. She wants to go home. Patient understands the risks of continued drinking. She understands that she needs to stay sober to get her gallbladder operated on. At this point she does not meet commitment criteria. Educational therapy done with the patient about her drinking and mental health treatment. I have left a message with the [employer] act team telling them that the patient is probably going to be discharged home today. No change to psychiatric treatment. I will check in on her again tomorrow if she is still here but otherwise it looks like we are not going to be able to transfer her to psychiatry.   Electronic Signatures: Gonzella Lex (MD)  (Signed 20-Feb-14 12:10)  Authored:  Details   Last Updated: 20-Feb-14 12:10 by Gonzella Lex (MD)

## 2015-04-01 NOTE — Consult Note (Signed)
Details:   - Psychiatry:Patient was off the unit perhaps recovering from [place] today when I came to round. Once she is ready to go home from [place] she can be discharged to group home and the act team will follow.   Electronic Signatures: Gonzella Lex (MD)  (Signed 21-Feb-14 23:25)  Authored: Details   Last Updated: 21-Feb-14 23:25 by Gonzella Lex (MD)

## 2015-04-02 LAB — URINE CULTURE

## 2015-04-02 NOTE — Consult Note (Signed)
PATIENT NAME:  Veronica [name], KOZLOV MR#:  [id number] DATE OF BIRTH:  07-May-1951  DATE OF CONSULTATION:  09/17/2014  REFERRING PHYSICIAN:   CONSULTING PHYSICIAN:  Gonzella Lex, MD  IDENTIFYING INFORMATION AND REASON FOR CONSULTATION: A 64 year old woman who presented to the hospital with complaints of falls, weakness, and possible mental status changes. Consult was out of concern that falls or sedation are related to her psychiatric medicine.   HISTORY OF PRESENT ILLNESS: Information obtained from the patient and the chart. The patient presented to the Emergency Room yesterday with nausea, intermittent vomiting, falls, mental status changes, low blood pressure. Last night, evidently, she was noted to be very sedated and have particularly low blood pressure in the evening, which was probably after she had gotten some of her medication. On interview today, the patient tells me that she has been falling down a lot. Says that it happens several times a week and has been going on for about a year. She thinks it has been going on for approximately the same time she has been getting chemotherapy, but she is a little vague about this. She describes the feeling as her knee buckling and going out from under her, although she says she gets dizzy at the same time. She emphasizes that on several occasions she has been caught by her boyfriend or other people. Says there have been occasions when she has actually fallen and hit the ground as well. She is unable to tell me exactly whether the episodes happen only in the evening or at any time of the day. Several of the ones she describes to me sound like they happen during the day. The patient thinks they could be related to her medications, but also thinks they could be related to her chemotherapy. She says that she has been feeling more run down and tired recently. Denies any mood symptoms. Denies feeling depressed or hopeless. Denies mania. Denies psychotic symptoms. She  says she has not been drinking in at least a year.   PAST PSYCHIATRIC HISTORY: This patient has a history of schizoaffective disorder and alcohol abuse. She has been in the hospital for alcohol withdrawal in the past. She has been maintained on the same medications for years. Gets her medicines prescribed, I believe, through [facility].   MEDICATIONS: Include a Prolixin decanoate shot, Trileptal, Cogentin, Zoloft, all for her history of bipolar disorder, also Risperdal. The patient has had presentations with full manias in the past. She thinks that she takes the Trileptal for seizures. It is unclear to me from reviewing the chart whether that is the case or whether it is supposed to be a psychiatric medicine. No known history of violent behavior.   SUBSTANCE ABUSE HISTORY: A history of heavy alcohol abuse, but says she stopped using when she had lymphoma diagnosed and has not been drinking since then.   SOCIAL HISTORY: Lives by herself, has a boyfriend who sees her frequently. Also has an adult son who tries to stay in frequent contact with her such that she spends the night, it sounds like, at one or the other of their houses pretty often.   FAMILY HISTORY: No known family history of mental illness.   CURRENT MEDICATIONS: As far as I can tell, it currently seems to be Advair Diskus 1 puff twice a day, allopurinol 200 mg a day, amlodipine once a day, donepezil 1 tablet once a day probably the 10 mg, Atrovent inhaler, Cogentin 0.5 mg daily, colchicine 0.6 mg twice daily, dicyclomine  10 mg 3 times daily, fentanyl patch 25 mcg every 3 days, gabapentin 300 mg 3 times a day, meloxicam 7.5 mg every other day as needed, multivitamins once a day, Nexium 40 mg a day, oxcarbazepine 600 mg at night, oxycodone as needed potassium chloride 20 mEq twice a day, ProAir inhaler 2 puffs 4 times a day as needed, Risperdal 1.5 mg once a day, Spiriva HandiHaler 18 mg a day, trazodone 50 mg at night. She also gets a Prolixin  decanoate shot of 25 mg every 2 weeks.   ALLERGIES: No known drug allergies.   REVIEW OF SYSTEMS: On my interview, the patient had no new complaints. Denied suicidal or homicidal ideation. Denied hallucinations. Denied feeling depressed. Physically, she was still feeling a little sick to her stomach, was not vomiting. Had not gotten up out of bed that day, but still felt a little dizzy where she was. The rest of the review of systems was negative.   MENTAL STATUS EXAMINATION: A chronically ill-appearing woman interviewed in a hospital bed. She was awake, alert, and oriented, recognized me, was appropriately interactive. Eye contact good. Psychomotor activity normal. Speech normal rate, tone, and volume. Affect slightly anxious but not bizarre. Mood stated as being okay. Thoughts are lucid if a little bit slow. No obvious loosening of associations. No evidence of delusions. Denies auditory or visual hallucinations. Denies suicidal or homicidal ideation. Could recall 1/3 objects at 3 minutes. Alert and oriented x 4. Judgment and insight appear adequate. Normal fund of knowledge.   VITAL SIGNS: Clostridium difficile was negative. The admitting labs showed a chemistry profile with elevated creatinine at 1.3, low potassium 3.2, elevated alkaline phosphatase of 122. Hematocrit on admission was low at 30 with hemoglobin low at 9.7. That has actually gone down since admission. More recently it has been read as a hemoglobin of 8.8, hematocrit 27.8, white count is 2.7. EKG normal sinus rhythm, possible old infarct. No drug screen or alcohol level done. Head CT shows chronic involutional changes, nothing acute. Urinalysis shows 3+ leukocyte esterase, a very large number of white blood cells, trace bacteria.   VITAL SIGNS: The vitals have been somewhat remarkable and how low her blood pressure has been it looks like she bottomed out last night with systolics as low as the high 70s. It has come back up, most recently  being read as 109/76. She has been getting IV fluid, I believe, all day.   ASSESSMENT: A 64 year old woman with schizoaffective disorder. Currently having falls, which are probably at least in part related to orthostatic hypotension given her low blood pressure. Her psychiatric medicines appear to have been stable for years. There does not seem to have been any change in them and there is no indication that she has been abusing anything. She used to drink heavily, but claims she is not doing that anymore and we do not have any evidence to the contrary. She does have multiple medical problems including the lymphoma, the chemotherapy, the anemia, and the urinary tract infection, all of which could tend to make her a little more sensitive. At this point, I do not think I would recommend making any changes to her psychiatric medicines. I doubt that they are a major contributor to the weakness or the falls. None of the medicines that she takes are particularly concerning for causing orthostatic hypotension. I expressed this to the patient. She is agreeable to it. I will continue to follow up to see how she is doing over the  next couple of days.   DIAGNOSIS, PRINCIPAL AND PRIMARY:  AXIS I: Schizoaffective disorder, bipolar type, stable.   SECONDARY DIAGNOSES: AXIS I: Alcohol abuse, in sustained remission.  AXIS II: No diagnosis.  AXIS III: Orthostatic hypotension anemia, anemia, urinary tract infection, lymphoma.    ____________________________ Gonzella Lex, MD jtc:at D: 09/17/2014 17:12:30 ET T: 09/17/2014 17:44:49 ET JOB#: [id number]  cc: Gonzella Lex, MD, <Dictator> Gonzella Lex MD ELECTRONICALLY SIGNED 09/20/2014 0:23

## 2015-04-02 NOTE — H&P (Signed)
PATIENT NAME:  Veronica [name], Veronica [name] MR#:  [id number] DATE OF BIRTH:  August 19, 1951  DATE OF ADMISSION:  09/16/2014  PRIMARY CARE PHYSICIAN: Perrin Maltese, MD   ONCOLOGIST: Lloyd Huger, MD   HISTORY OF PRESENT ILLNESS: The patient is a 64 year old African American female with past medical history significant for history of B cell lymphoma, on chemotherapy, last 1 was on the 08/23/2014, with Rituxan, cyclophosphamide as well as vincristine, and  prednisone after which patient received also Neulasta shot due to leukopenia; presents to the hospital with complaints of weakness, falls. According to patient, she was doing well up until approximately a few weeks ago when she had her chemotherapy. She has been nauseated, vomiting intermittently after chemotherapy, and she has been falling. On arrival to the Emergency Room, patient's systolic blood pressure was found to be 65. She was given 1 liter of IV fluid bolus, and her blood pressure improved somewhat. She was also noted to be leukopenic on her lab studies, and had mild renal insufficiency. Her urinalysis was markedly abnormal and because of concerns about infection, although patient was afebrile, hospitalist services were contacted for admission. Apparently patient was also noted to be altered with altered mental status, very slow responses but her blood pressure has improved now, and her mental status has improved as well.   PAST MEDICAL HISTORY: Significant for history of B cell lymphoma on chemotherapy, status post last chemotherapy cycle 5, of Rituxan cyclophosphamide, vincristine as well as prednisone, supported by Neulasta on 08/23/2014, by Dr. Grayland Ormond, history of chronic pain syndrome, hypokalemia, history of schizophrenia.   MEDICATIONS: According to medical records, history of schizoaffective disorder and psychosis, hypertension, asthma, alcohol abuse, tobacco abuse, ongoing marijuana abuse; questionable congestive heart failure according to the  Emergency Room physician's notes, although apparently patient denied congestive heart failure on prior admissions.   PAST SURGICAL HISTORY: Gallbladder surgery February 2014, by Dr. Phylis Bougie; history of also surgical hysterectomy.   ALLERGIES: To medication none.   MEDICATIONS:  List is as follows, the patient is on Advair Diskus 250/50 one puff twice daily, allopurinol 200 mg p.o. daily,  amlodipine, donepezil  5/40 one tablet once daily; Lomotil 0.025 mg/2.5 mg 2 tablets 3 times daily as needed; benztropine 0.5 mg twice daily, colchicine 0.6 mg twice daily, dicyclomine 10 mg 3 times daily; fentanyl transdermal film 25 mcg topically every 72 hours; gabapentin 300 mg p.o. 3 times daily; meloxicam 7.5 mg p.o. every second day as needed; multivitamins once daily, Nexium 40 mg p.o. daily, oxcarbazepine 600 mg p.o. at bedtime; oxycodone 5 mg every 8 hours as needed; potassium chloride 20 mg twice daily, ProAir HFA 2 puffs 4 times daily as needed; risperidone 1 mg 1/2 tablet once daily; fish oil 100 mg p.o. in the morning, Spiriva 18 mg capsule once daily, trazodone 50 mg p.o. at bedtime.   SOCIAL HISTORY: Lives at home by herself, continues to smoke approximately half pack a day; in the past smoked approximately 1 pack a day; has been smoking since the age of 30, more than 36 years; used to drink 2 to 3 shots of vodka or gin, now recently beer; however, denies any significant beer use recently, used smoke marijuana in the past, she denies any marijuana recently.   FAMILY HISTORY: Hypertension, heart disease in the family.   REVIEW OF SYSTEMS:  GENERAL: Positive for weakness, weight loss approximately 20 pounds in the past few weeks, intermittent nausea, vomiting, as well as diarrhea which seems to be chronic; also  admits to some sinus congestion, chest congestion, some postnasal drip, cough, as well as wheezes, and denies any significant phlegm production; admits of phlegm production, dark phlegm over the  past 1 month, some chest pains, especially whenever she coughs, nausea, vomiting, as well as diarrhea intermittently for which she takes numerous medications, also urge incontinence or urinary incontinence; denies any fevers, pains, weight gain.  EYES: Denies any blurry vision, double vision, glaucoma or cataracts.  ENT: Denies any tinnitus, allergies, epistaxis, sinus pain, dentures, difficulty swallowing.  RESPIRATORY: Denies any hemoptysis, asthma, chronic obstructive pulmonary disease.  CARDIOVASCULAR: Denies orthopnea, edema, arrhythmias, or syncopal event.  ABDOMEN: Denies any pain, rectal bleeding, change in bowel habits.  GENITOURINARY: Denies any dysuria, hematuria, or frequency.  ENDOCRINE:  no heat or cold intolerance.Marland Kitchen  HEMATOLOGIC:  Denies anemia, easy bruising, bleeding, swollen glands.  SKIN: Denies any acne, rashes, any moles.  MUSCULOSKELETAL: Denies arthritis, cramps, swelling.  NEUROLOGIC: Denies numbness, weakness, or tremors.   PSYCHIATRIC: Denies any anxiety, insomnia, or depression.   VITAL SIGNS: On arrival to the hospital, patient's temperature was 97.3, pulse was 87, respiration rate was 16, blood pressure 62/51, saturation was 96% on room air; on the day of my evaluation, her blood pressure is 220 to 254Y systolic; her temperature was 97.3, pulse was 91, respiration was 18, oxygen saturation was 95% to 96% on room air at rest.  GENERAL: This is a well-developed, well-nourished [place] American female in no significant distress siting on the stretcher.  HEENT: Her pupils were equal, reactive to light, extraocular muscles intact, no icterus or conjunctivitis,  mucosa is moist.  NECK: No masses, nontender, thyroid is not enlarged. No adenopathy. No JVD or carotid bruits bilaterally, full range of motion.  CHEST: A Port-A-Cath is noted on the chest.  LUNGS: Diminished breath sounds bilaterally as well as rhonchi and wheezing, some labored inspirations especially whenever she  moves around. No dullness to percussion. Not in overt respiratory distress, in moderate distress whenever she speaks.  CARDIOVASCULAR: S1, S2 appreciated, rhythm is regular, PMI not lateralized. Chest is nontender to palpation, 1+ pedal pulses, no lower extremity edema, calf tenderness or cyanosis was noted.  ABDOMEN: Soft, nontender, bowel sounds are present, no hepatosplenomegaly or masses were noted.  RECTAL: Deferred.  MUSCLE STRENGTH: Able to move all extremities. No cyanosis, degenerative joint disease; the patient does have some mild kyphosis.  GAIT: Not tested.  SKIN: No rashes, lesions, erythema, nodularity or induration, it was warm and dry to palpation.  LYMPHATIC: No adenopathy in the cervical region.  NEUROLOGICAL: Cranial nerves grossly intact. Sensory is intact. No dysarthria or aphasia. The patient is alert, oriented to person and place, cooperative; memory is somewhat impaired, there is no significant confusion, agitation, or depression were noted.   LABORATORY: BMP showed creatinine of 1.36, potassium 3.2, otherwise BMP was unremarkable, alcohol level less than 3; cardiac enzymes x 1, were within normal limits. White blood cell count is low at 2.7, hemoglobin of 8.8, and platelet count was 127,000, absolute neutrophil count was 1.7; urinalysis, yellow cloudy urine, negative for glucose, bilirubin or ketones, specific gravity 1.009, pH was 8.2, with 1+ blood, 100 mg/dL protein, positive for nitrites, 3+ leukocytes esterase, 13 red blood cells, 739 white blood cells, trace bacteria, 1 epithelial cell, white blood cell clumps were present as well as 18 hyaline casts.   RADIOLOGIC STUDIES: Chest x-ray, portable single view 09/16/2014, revealed a Port-A-Cath in good anatomic position, no acute abnormality was noted; CT scan of the  head without contrast 09/16/2014, showed chronic involutional changes with no acute changes.   ASSESSMENT AND PLAN: 1.  Sepsis with hypotension.  Admit patient  to medical floor. Continue patient on IV fluids. We will get blood cultures, sputum cultures, and we will start the patient on Rocephin.  2.  Hypotension. We will continue IV fluids. We will hold blood pressure medications.  3.  Renal insufficiency. We will continue fluids. We will follow creatinine in the morning. We will get kidney ultrasound to rule out structural abnormalities.  4.  Urinary tract infection, questionable acute pyelonephritis. We will continue antibiotics for now as well as getting cultures, adjust antibiotics according to culture results, get a kidney ultrasound.  5.  Diarrhea we will add cholestyramine, we will continue Lomotil as well as intravenous fluids.    6.  Chronic obstructive pulmonary disease exacerbation. We will continue inhaler and nebulizers, and get sputum cultures, now on antibiotic therapy.   TIME SPENT: Was 50 minutes on this patient.    ____________________________ Theodoro Grist, MD rv:nt D: 09/16/2014 17:47:20 ET T: 09/16/2014 18:51:06 ET JOB#: [id number]  cc: Theodoro Grist, MD, <Dictator> Perrin Maltese, MD Kathlene November. Grayland Ormond, MD Theodoro Grist MD ELECTRONICALLY SIGNED 10/08/2014 11:40

## 2015-04-02 NOTE — Discharge Summary (Signed)
PATIENT NAME:  Veronica [name], Veronica [name] MR#:  [id number] DATE OF BIRTH:  08/11/1951  DATE OF ADMISSION:  09/16/2014 DATE OF DISCHARGE:  09/18/2014  ADMISSION DIAGNOSES:  1.  Sepsis.  2.  Urinary tract infection.   DISCHARGE DIAGNOSES: 1.  Sepsis with hypotension secondary to urinary tract infection.  2.  Hypotension.  3.  Pancytopenia.  4.  History of B-cell lymphoma, status post chemotherapy on October 5.  5.  Chronic pain.   CONSULTATIONS:  1.  Dr. Grayland Ormond.  2.  Dr. Weber Cooks.   PERTINENT LABORATORIES: Urine culture: Mixed bacterial organisms suggestive of contamination. Blood culture negative.   Renal ultrasound showed mild right-sided hydronephrosis with improvement postvoid.   HOSPITAL COURSE: This is a 64 year old female with B-cell lymphoma who presented with hypotension. For further details, please refer to the H and P.  1.  Sepsis with hypotension. The patient was admitted with this diagnosis. She was placed on broad-spectrum antibiotics. Her sepsis is secondary to a urinary tract infection. Her blood pressure has improved. Blood culture is negative to date, so the source of her sepsis is a urinary tract infection. Her urine culture did show mixed contamination but she responded well to Rocephin, so she will be discharged with Keflex.  2.  Acute kidney injury, improved with IV fluids. Her ultrasound showed mild hydronephrosis, which improved postvoid, so I suspect it is related to urinary retention.  3.  Urinary tract infection without hematuria. The patient will continue with p.o. antibiotics at discharge.  4.  History of chronic obstructive pulmonary disease with mild exacerbation. The patient was presently not short of breath. She will continue on her outpatient medications including Spiriva.  5.  Tobacco abuse. The patient was counseled by the admitting physician.  6.  Schizophrenia/bipolar affective disorder. The patient will continue on Cogentin, Trileptal, and Risperdal.  7.   History of essential hypertension. Her blood pressure has improved with IV fluids.  8.  Pancytopenia secondary to recent chemotherapy. The patient can have followup with Dr. Grayland Ormond as an outpatient. At this time, the plan was just to monitor.   PHYSICAL EXAMINATION:  VITAL SIGNS: On discharge, temperature 98, pulse 96, respirations 20, blood pressure 131/95, 96% on room air.  GENERAL: The patient was alert, oriented x 3, not in acute distress.  CARDIOVASCULAR: Regular rate and rhythm. No murmurs, gallops or rubs. PMI is not displaced.   LUNGS: Clear to auscultation without crackles, rales, rhonchi, or wheezing. Normal to percussion.  ABDOMEN: Bowel sounds are positive, nontender, and nondistended. No hepatosplenomegaly.  EXTREMITIES: No clubbing, cyanosis or edema.  NEUROLOGIC: Cranial nerves II through XII are intact.   DISCHARGE MEDICATIONS: 1.  Zoloft 100 mg in the morning.  2.  Spiriva 18 mcg daily.  3.  Dicyclomine 10 mg t.i.d.  4.  Multivitamin 1 tablet daily.  5.  Nexium 40 mg daily.  6.  ProAir 2 puffs 4 times a day p.r.n.  7.  Advair Diskus 250/50 b.i.d.  8.  Trazodone 50 mg at bedtime.  9.  Gabapentin 600 mg t.i.d.  10.   Oxycodone 5 mg q. 8 hours p.r.n. pain.  11.   Fentanyl 25 mcg q. 72 hours.  12.   Allopurinol 100 mg 2 tablets daily.  13.   Benztropine 0.5 mg b.i.d.  14.   Colchicine 0.6 mg b.i.d.  15.   Risperdal 1 mg 1.5 tablets p.o. at bedtime.  16.   Oxcarbazepine 600 mg at bedtime.  17.   Atropine 1 tablet t.i.d.  p.r.n. diarrhea.  18.   Nicotine 14 mg per 24 hours.  19.   Keflex 500 mg p.o. q.i.d. x 7 days.   Discharge home health, physical therapy, nurse aide for assessment and gait.   DISCHARGE DIET: Low sodium.   DISCHARGE ACTIVITY: As tolerated.   We have held amlodipine/benazepril with followup from the home nurse. Once her blood pressure is consistently above 916 systolic, she may resume her Norvasc/benazepril.   Recommendations for PT where skilled  nursing facility; however, the patient refused to go to a skilled nursing facility and she was actually high functional and able to ambulate well even without a walker, so she was discharged home with home health.   TIME SPENT: Approximately 45 minutes at discharge.    ____________________________ Rigo Letts P. Benjie Karvonen, MD spm:at D: 09/18/2014 94:50:38 ET T: 09/18/2014 13:23:30 ET JOB#: [id number]  cc: Kelise Kuch P. Benjie Karvonen, MD, <Dictator> Vedia Coffer Tawnya Pujol P Diego Delancey MD ELECTRONICALLY SIGNED 09/18/2014 21:22

## 2015-04-02 NOTE — Op Note (Signed)
PATIENT NAME:  Veronica [name], Veronica [name] MR#:  [id number] DATE OF BIRTH:  [birth year]  DATE OF PROCEDURE:  03/15/2014  SURGEON: Louis Matte, M.D.   ASSISTANT: None.   PREOPERATIVE DIAGNOSIS:  Neck mass.  POSTPERATIVE DIAGNOSIS: Neck mass (probable lymphoma).   OPERATION PERFORMED: Biopsy of left neck mass and insertion of ultrasound-guided Port-A-Cath placement.   INDICATIONS FOR PROCEDURE: Veronica [name] is a 64 year old woman with an enlarging neck mass that was felt to be most consistent with lymphoma. The indications and risks of the above-named procedures were explained to the patient and she gave her informed consent.   DESCRIPTION OF PROCEDURE: The patient was brought to the operating suite and placed in the supine position. General endotracheal anesthesia was given. The right neck mass was palpable and ultrasound showed a large internal jugular vein. The patient was then prepped and draped in the usual sterile fashion. We began by using the ultrasound to percutaneously catheterize the right internal jugular vein with a wire. We then elected to make an incision over the mass. This was deepened down through the soft tissues until the mass was identified. There was a fleshy dense mass and multiple biopsies were taken with a cupped biopsy forceps. These were sent for frozen section and the frozen section returned a lymphoid proliferation. Hemostasis was complete. We then made a port site on the anterior chest wall by making an incision and carrying it down in a subcutaneous position. We then passed the catheter from our port site up to our biopsy site and then up to the neck. We then placed the catheter through a peel-away sheath over the wire and positioned it appropriately at the superior vena cava-right atrial junction. This was done under fluoroscopic guidance. The catheter was then completely assembled and secured to the anterior chest wall with three 2-0 Prolene sutures. The catheter was then flushed  and had good blood return. The wounds were then closed in layers of Vicryl and nylon. Sterile dressings were applied. The patient was then transported to the recovery room after extubation in stable condition.   ____________________________ Lew Dawes Genevive Bi, MD teo:cs D: 03/15/2014 17:22:19 ET T: 03/15/2014 20:25:30 ET JOB#: [id number]  cc: Christia Reading E. Genevive Bi, MD, <Dictator> Louis Matte MD ELECTRONICALLY SIGNED 03/16/2014 14:09

## 2015-04-02 NOTE — Consult Note (Signed)
Note Type Consult   Subjective: Chief Complaint/Diagnosis:   B-cell lymphoma, now with hypertension and UTI/sepsis. HPI:   Patient is a 64 year old female undergoing chemotherapy with R-CVP for stage III B-cell lymphoma. Her last treatment was on September 13, 2014. She is admitted to the hospital yesterday with hypotension, confusion, and UTI/sepsis. Currently she feels improved and is nearly back to her baseline. She continues to feel fatigued.  Her pain is well-controlled with her current narcotic regimen. She has no neurologic complaints. She denies any recent fevers. She denies any chest pain or shortness of breath. She has a good appetite and denies weight loss. She denies any nausea, vomiting, constipation, or diarrhea. Patient offers no further specific complaints today.    Review of Systems:  Performance Status (ECOG): 2  Pain ?: Yes  Pain- Qualitative: Moderate  Pain- Plan: Narcotic analgesic ordered  Discussed with patient: Prohylactic stimulant laxative  Stool softner  Dietary fiber  Pain Effectiveness: Pain relief not obtained  Emotional well-being: Anxiety  Emotional Well-being: Educational material provided  Review of Systems:   As per HPI. Otherwise, 10 point system review was negative.   Allergies:  No Known Allergies:   Preventive Screening:  Has patient had any of the following test? Mammography   Last Mammography: 04/2013   Smoking History: Smoking History 0.5-1 Packs per day and pt refused smoking cessation. Smoking Cessation Information Given to Patient .  PFSH: Additional Past Medical and Surgical History: Hypertension, osteoarthritis, fibrosed myalgia, schizophrenia, schizoaffective disorder, bipolar disorder, partial hysterectomy.    Family history: Positive for head and neck cancer.    Social history: Positive tobacco one pack per day >19yrs.  Patient refused smoking cessation counseling.  Often alcohol use as well as marijuana use.   Home  Medications: Medication Instructions Last Modified Date/Time  fentaNYL 25 mcg/hr transdermal film, extended release 1 patch transdermal every 72 hours 09-Oct-15 14:07  ProAir HFA CFC free 90 mcg/inh inhalation aerosol 2 puff(s) inhaled 4 times a day, As Needed - for Shortness of Breath 08-Oct-15 12:58  sertraline 100 mg oral tablet 1 tab(s) orally once a day (in the morning) 08-Oct-15 12:58  dicyclomine 10 mg oral capsule 1 cap(s) orally 3 times a day 08-Oct-15 12:58  multivitamin 1 tab(s) orally once a day 08-Oct-15 12:58  risperiDONE 1 mg oral tablet 1.5 tab(s) orally once a day (at bedtime) 08-Oct-15 12:58  gabapentin 600 mg oral tablet 1 tab(s) orally 3 times a day 08-Oct-15 12:58  traZODone 50 mg oral tablet 1 tab(s) orally once a day (at bedtime) 08-Oct-15 12:58  potassium chloride 20 mEq oral tablet, extended release 1 tab(s) orally 2 times a day 08-Oct-15 12:58  oxyCODONE 5 mg oral tablet 1 tab(s) orally every 8 hours, As Needed - for Pain 08-Oct-15 12:58  colchicine 0.6 mg oral tablet 1 tab(s) orally 2 times a day 08-Oct-15 12:58  allopurinol 100 mg oral tablet 2 tab(s) orally once a day 08-Oct-15 12:58  benztropine 0.5 mg oral tablet 1 tab(s) orally 2 times a day 08-Oct-15 12:58  Advair Diskus 250 mcg-50 mcg inhalation powder 1 puff(s) inhaled 2 times a day 08-Oct-15 12:58  meloxicam 7.5 mg oral tablet 1 tab(s) orally every other day, As Needed - for Pain 08-Oct-15 12:58  NexIUM 40 mg oral delayed release capsule 1 cap(s) orally once a day 08-Oct-15 12:58  Spiriva 18 mcg inhalation capsule 1 each inhaled once a day (in the morning) 08-Oct-15 12:58  amlodipine-benazepril 5 mg-40 mg oral capsule 1 cap(s) orally  once a day 08-Oct-15 12:58  atropine-diphenoxylate 0.025 mg-2.5 mg oral tablet 2 tablets stat, then 1 tab(s) orally 3 times a day, As Needed - for Diarrhea 08-Oct-15 12:58  OXcarbazepine 600 mg oral tablet 1 tab(s) orally once a day (at bedtime) 08-Oct-15 12:58   Vital Signs:   :: vital signs stable, patient afebrile.   Physical Exam:  General: well developed, well nourished, and in no acute distress  Mental Status: normal affect  Eyes: anicteric sclera  Neck, Thyroid: supraclavicular mass is now minimally palpable.  Respiratory: clear to auscultation bilaterally  Cardiovascular: regular rate and rhythm, no murmur, rub, or gallop  Gastrointestinal: soft, nondistended, nontender, no organomegaly.  normal active bowel sounds  Musculoskeletal: No edema  Skin: No rash or petechiae noted  Neurological: alert, answering all questions appropriately.  Cranial nerves grossly intact   Laboratory Results: Hepatic:  09-Oct-15 04:42   Bilirubin, Total  0.1  Alkaline Phosphatase 90 (46-116 NOTE: New Reference Range 06/29/14)  SGPT (ALT)  11 (14-63 NOTE: New Reference Range 06/29/14)  SGOT (AST)  10  Total Protein, Serum  5.3  Albumin, Serum  2.6  Routine Chem:  09-Oct-15 04:42   Glucose, Serum  112  BUN 13  Creatinine (comp) 1.20  Sodium, Serum  148  Potassium, Serum 3.5  Chloride, Serum  116  CO2, Serum 24  Calcium (Total), Serum  8.1  Osmolality (calc) 295  eGFR (African American)  58  eGFR (Non-African American)  48 (eGFR values <45mL/min/1.73 m2 may be an indication of chronic kidney disease (CKD). Calculated eGFR, using the MRDR Study equation, is useful in  patients with stable renal function. The eGFR calculation will not be reliable in acutely ill patients when serum creatinine is changing rapidly. It is not useful in patients on dialysis. The eGFR calculation may not be applicable to patients at the low and high extremes of body sizes, pregnant women, and vetetarians.)  Anion Gap 8  Routine Hem:  09-Oct-15 04:42   WBC (CBC)  2.2  RBC (CBC)  2.83  Hemoglobin (CBC)  8.3  Hematocrit (CBC)  26.2  Platelet Count (CBC)  120  MCV 92  MCH 29.2  MCHC  31.6  RDW  17.7  Neutrophil % 72.4  Lymphocyte % 12.7  Monocyte % 4.7  Eosinophil % 9.0   Basophil % 1.2  Neutrophil # 1.6  Lymphocyte #  0.3  Monocyte #  0.1  Eosinophil # 0.2  Basophil # 0.0 (Result(s) reported on 17 Sep 2014 at 05:32AM.)   Lab Results Review:  Lab Results     Assessment and Plan: Impression:   B-cell lymphoma, now with hypertension and UTI/sepsis. Plan:   1. UTI/sepsis: Blood cultures negative to date. Agree with current antibiotics.Hypotension: Resolved.Pancytopenia: Secondary chemotherapy, monitor.Lymphoma: Patient received chemotherapy on September 13, 2014 with R-CVP. Patient indicated today that she did not have her prednisone therefore did not take it with this round of chemotherapy. She has been instructed to keep her previously scheduled followup appointment for her next round of chemotherapy.Pain: Continue fentanyl patch and Percocet. Patient was given prescription for these today. consult, call with questions.  Tumor Staging:  Tumor Staging Tumor Staging   Tumor Staging Source Clinical   Stage III   CD-20 (NHL) Status positive   Cancer Status Evidence of disease clinically   Stage/Additional Pathology unclassified.   Treatment Plan (Initial Course):  Intent Curative   Consent Treatment plan was discussed with patient, patient consented to chemotherapy to treat cancer.  Trastuzumab (Heceptin) Not recommended   Not Recommended Contraindicated   Regimen See Orders for Treatment Regimen   Regimen Rituxan 375 mg/m2, cyclophosphamide 600 mg/m2, vincristine 2 mg, prednisone 100 mg daily x5 days. Plan to do 6 cycles then reimage.   Is patient receiving oral chemotherapy? No   Advance Directive:  Forensic scientist Theatre stage manager) no   Advance Directive Information Given yes   Adv Dir Follow-up Patient does not desire follow-up with Chaplain regarding Advance Directives   Electronic Signatures: Delight Hoh (MD)  (Signed 09-Oct-15 16:37)  Authored: Note Type, CC/HPI, Review of Systems, ALLERGIES, Preventive  Screening, Smoking Cessation, Patient Family Social History, HOME MEDICATIONS, Vital Signs, Physical Exam, Lab Results Review, Assessment and Plan, Quality Measures, Advance Directive   Last Updated: 09-Oct-15 16:37 by Delight Hoh (MD)
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
## 2015-04-10 NOTE — H&P (Signed)
PATIENT NAME:  Veronica [name], Veronica [name] MR#:  [id number] DATE OF BIRTH:  [birth year]  DATE OF ADMISSION:  03/14/2015  PRIMARY CARE PHYSICIAN:  Dr. Lamonte Sakai.   ONCOLOGIST:  Dr. Grayland Ormond.   CHIEF COMPLAINT:  "I am here because of UTI."   HISTORY OF PRESENT ILLNESS:  The patient is a 64 year old African-American woman with past medical history of progressive B-cell lymphoma, stroke with right-sided weakness, COPD, anxiety, history of schizoaffective/schizophrenia and bipolar disorder, comes to the Emergency Room from home with complaints of neck pain and "symptoms of UTI."  The patient recently was admitted on March 13, discharged March 16 with p.o. Levaquin. The patient reports completing treatment with p.o. Levaquin for a UTI at that time. She complains of dysuria and change in color of her urine. She was hypotensive with blood pressure in the 80s, received a liter of bolus IV fluid, blood pressure went up to systolic 458, came down to 87 systolic, and during my evaluation is 94/54. She is somewhat slow to respond to, however answered all questions appropriately. She is being admitted. UA shows mild abnormality with 3 + leukocyte esterase, 65 WBCs. Her white count is less than 4000, but we will admit her for sepsis secondary to UTI, recurrent.   PAST MEDICAL HISTORY:   1. Progressive B-cell lymphoma, undergoing chemotherapy with Dr. Grayland Ormond.  2. History of stroke with right-sided weakness.  3. Hypertension.  4. Type 2 diabetes.  5. Gout.  6. Congestive heart failure.  7. COPD.   8. Bipolar disorder.  9. Schizophrenia/schizoaffective disorder.  10. History of cardiac valve replacement.  11. Osteoarthritis.  12. GERD.    SOCIAL HISTORY: Lives with her boyfriend. Smokes about half a pack a day for at least 20 years. She quit drinking alcohol 4-5 years ago. No illicit substance abuse.   FAMILY MEDICAL HISTORY: Positive for coronary artery disease in mother, also positive for head and neck cancer in  father.    ALLERGIES: No known drug allergies.   MEDICATIONS:  1.  Trazodone 50 mg at bedtime.  2.  Spiriva 18 mcg inhalation.   REVIEW OF SYSTEMS:  CONSTITUTIONAL: No fever. Positive for fatigue, weakness.  EYES: No blurred or double vision, inflammation, or cataracts.  EARS, NOSE, AND THROAT: No tinnitus, ear pain, hearing loss, or postnasal drip.  RESPIRATORY: No cough, wheeze, hemoptysis, or dyspnea.  CARDIOVASCULAR: No chest pain, orthopnea, edema. Positive for hypotension.  GASTROINTESTINAL: No nausea, vomiting, diarrhea, abdominal pain. No GERD.  GENITOURINARY: No dysuria, hematuria.  ENDOCRINE: No polyuria, nocturia, or thyroid problems.  HEMATOLOGY: No anemia or easy bruising.  SKIN: No acne, rash, or lesion.  MUSCULOSKELETAL: Positive for arthritis. No swelling or gout.  NEUROLOGIC: No CVA, TIA, seizures, or dementia.  PSYCHIATRIC: No anxiety or depression.    All other systems reviewed and negative.   PHYSICAL EXAMINATION:  GENERAL: The patient is awake, alert, oriented x 2. Temperature is 98.7, pulse is 85, respirations 17, blood pressure is 87/74, saturations are 94% on room air.  HEENT: Atraumatic, normocephalic. Pupils PERRLA.  EOM intact. Oral mucosa is dry.  NECK: Supple. No JVD. No carotid bruit.  LUNGS: Clear to auscultation bilaterally. No rales, rhonchi, respiratory distress, or labored breathing.  HEART: Both the heart sounds are normal. Rate and rhythm regular. PMI not lateralized. Chest nontender.  EXTREMITIES: Good pedal pulses, good femoral pulses. No lower extremity edema. ABDOMEN: Soft, benign, nontender, and no organomegaly. Positive bowel sounds.  NEUROLOGIC: Grossly intact cranial nerves II through XII. No motor  or sensory deficit.  PSYCHIATRIC: The patient is awake, intermittently gets lethargic, however answers all questions appropriately.   EKG shows normal sinus rhythm, possible left atrial enlargement.   UA, 3 + leukocyte esterase. White count  is 65. CT head shows prior infarct and small vessel disease, no acute-appearing infarct. CT cervical spine, multiple sclerotic lesions of the cervical spine felt to be present widespread bony metastatic disease from known lymphoma. No fracture or spondylolisthesis, no cord compression.   Chest x-ray shows continued improvement of right upper lobe infiltrate, stable right peritracheal adenopathy noted.   ASSESSMENT: A 64 year old, Veronica [name], with history of B-cell lymphoma undergoing chemotherapy at the cancer center, who was recently admitted with sepsis due to urinary tract infection in March, comes in with:   1.  Recurrent sepsis due to urinary tract infection. She was very hypotensive, likely cause of her feeling weak and tired. She received about a liter and a half of fluids, came in with blood pressure of 85 systolic, went up to 073 after about a liter and a half of fluids, came down to 94 again. She will be admitted to the medical floor. We will continue IV fluids for hydration, follow blood culture and urine culture. We will continue IV Rocephin.  2.  Urinary tract infection.  We will cover her with IV Rocephin and follow blood culture and urine culture.  3.  Acute on chronic renal failure. Her baseline is around 1.2.  We monitor Is and Os. Continue IV hydration.  4.  Chronic anemia. Hemoglobin is 9 which is near baseline. No recent signs of bruising or bleeding. No further workup at this time.  5.  History of progressive B-cell lymphoma with CT scan of the cervical spine evident with multiple sclerotic lesions in the cervical spine consistent with possible bony metastases.  I will continue her fentanyl patch, oxycodone, and IV p.r.n. morphine, and Tylenol for mild pain. We will let her follow up with Dr. Grayland Ormond as outpatient.  6.  Deep vein thrombosis prophylaxis. Subcutaneous heparin t.i.d.  7.  History of anxiety, panic attacks/bipolar disorder/schizophrenia. We will continue her  psychiatric medications which are sertraline, risperidone, fluphenazine IM shots, benztropine and trazodone.  8.  History of chronic obstructive pulmonary disease with ongoing tobacco abuse. The patient advised smoking cessation, about 4 minutes spent.  She  is not in any respiratory distress.  We will continue her inhalers. Smoking cessation again advised.  9.  Gastroesophageal reflux disease. Continue PPI.  10.  Further workup per the patient's clinical course. No family member present in the Emergency Room.   TIME SPENT: 55 minutes.     ____________________________ Hart Rochester Posey Pronto, MD sap:bu D: 03/14/2015 16:42:05 ET T: 03/14/2015 17:05:36 ET JOB#: [id number]  cc: Sweetie Giebler A. Posey Pronto, MD, <Dictator> Perrin Maltese, MD Kathlene November. Grayland Ormond, MD Ilda Basset MD ELECTRONICALLY SIGNED 03/15/2015 16:00

## 2015-04-10 NOTE — H&P (Signed)
PATIENT NAME:  Veronica [name], Veronica [name] MR#:  [id number] DATE OF BIRTH:  May 31, 1951  DATE OF ADMISSION:  02/22/2015  REFERRING EMERGENCY ROOM PHYSICIAN:  Elta Guadeloupe R. Jacqualine Code, MD   PRIMARY CARE PHYSICIAN:  Dr.  Humphrey Rolls   ONCOLOGIST: Kathlene November. Grayland Ormond, MD    CHIEF COMPLAINT: Syncopal event.   HISTORY OF PRESENT ILLNESS: This very pleasant 64 year old woman with past medical history of progressive B-cell lymphoma, stroke with right-sided weakness, COPD, and anxiety presents today after a syncopal event.  She reports that she has had a cough for the past few days, but otherwise has been in her normal state of health. Today, when she was standing up to get out of a car, she fainted. She fell all the way to the ground and her boyfriend reports that she lost consciousness for about 5-10 minutes. He states that he was trying to wake her up and she would push them away say "Leave me alone" but that was all she would do. She only remembers waking up in the ambulance. On presentation to the Emergency Room, she was very hypotensive with initial pressures in the 60s/40s. Hospitalist service is asked to admit for sepsis with UTI.   PAST MEDICAL HISTORY: 1.  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 Grayland Ormond.  2.  History of stroke with right-sided weakness.  3.  Hypertension.  4.  Diabetes mellitus type 2.  5.  Gout.  6.  Congestive heart failure.  7.  COPD.  8.  Bipolar disorder.  9.  Schizophrenia.  10.  History of cardiac valve replacement.  11.  Osteoarthritis.  12.  Gastroesophageal reflux disease.   SOCIAL HISTORY: She currently lives with her boyfriend. She smokes about 1/2 pack per day for at least 20 years. She quit drinking alcohol 4-5 years ago. No illicit substance abuse.   FAMILY MEDICAL HISTORY: Positive for coronary artery disease in her mother, also positive for head and neck cancer in her father.   ALLERGIES: No known allergies.   HOME MEDICATIONS: 1.  Spiriva 18  mcg per inhalation, 1 inhalation daily.  2.  Sertraline 100 mg 1 tablet once a day.  3.  Risperidone 1 mg 1.5 tablets once a day at bedtime.  4.  ProAir HFA CFC-free 90 mcg per inhalation, 2 puffs inhaled 4 times a day as needed for shortness of breath.  5.  Oxycodone 5 mg 1 tablet every 8 hours as needed for pain.  6.  Oxybutynin extended release 5 mg tablet 1 tablet once a day.  7.  Oxcarbazepine 600 mg 1 tablet twice a day.  8.  Nexium 40 mg 1 capsule once a day.  9.  Multivitamin 1 tablet daily.  10.  Gabapentin 600 mg 1 tablet 3 times a day.  11.  Fluphenazine decanoate 25 mg per mL, 1 injection every 2 weeks.  12.  Fentanyl 25 mcg per hour transdermal patch, 1 patch every 72 hours.  13.  Benztropine 0.5 mg 1 tablet twice a day.  14.  Allopurinol 100 mg 2 tablets once a day.  15.  Advair Diskus 250 mcg-50 mcg 1 puff inhaled twice a day.    REVIEW OF SYSTEMS: CONSTITUTIONAL: Negative for fevers, chills. Positive for weakness. No change in weight.  HEENT: No change in vision or hearing. No pain in the eyes or ears. No sinus congestion or difficulty swallowing.  RESPIRATORY: Positive for cough, shortness of breath, and wheezing which is slightly worse than her baseline. She has had a  pleuritic-type chest pain as well. No hemoptysis.  CARDIOVASCULAR: No chest pain, edema, orthopnea. Positive for syncope as described above.  GASTROINTESTINAL: No nausea, vomiting, diarrhea, or change in bowel habits.  GENITOURINARY: No dysuria or frequency.  MUSCULOSKELETAL: No new pain in the neck, back, knees, shoulders, or hips. No gout.  SKIN: No new rashes or lesions.  HEMATOLOGIC: No easy bruising or bleeding.  ENDOCRINE: No hot or cold intolerance. No polyuria or polydipsia.  NEUROLOGIC: Positive for syncopal event with prolonged loss of consciousness, no focal numbness or weakness. No seizure, confusion, or headache.  PSYCHIATRIC: Positive for schizophrenia and anxiety as well as bipolar disorder.    PHYSICAL EXAMINATION: VITAL SIGNS: Temperature 97.3, pulse 74, respirations 18, blood pressure 105/86, oxygenation 97% on 2 liters nasal cannula.  GENERAL: No acute distress.  HEENT: Pupils equal, round, and reactive to light. Conjunctivae clear, extraocular motion is intact. Oral mucous membranes are pink and moist. She has upper dentures and her lower teeth are intact. Posterior oropharynx is clear of exudate, erythema, or edema. NECK:  No cervical lymphadenopathy. Neck supple.  Trachea is midline. Thyroid is nontender.  RESPIRATORY: She has scattered wheezes throughout all lung fields. Crackles at the bases. No respiratory distress or coughing at the time of examination.  CARDIOVASCULAR: Regular rate and rhythm. No murmurs, rubs, or gallops. No peripheral edema. Peripheral pulses are 2+.  ABDOMEN: Soft. There is a fullness in the suprapubic area, which is nontender, but not comfortable either. She has a scar overlying this from a prior surgery. She states that her abdomen is always slightly distended. No guarding, no rebound, no hepatosplenomegaly.  MUSCULOSKELETAL: No joint effusions. Range of motion normal. Strength 5/5 throughout.  SKIN: No unusual lesions, rashes, or open wounds.  NEUROLOGIC: Cranial nerves II-XII are grossly intact. Strength and sensation are intact.  PSYCHIATRIC: She has a fairly flat affect, but is alert and oriented to her clinical condition.   LABORATORY DATA: Sodium 137, potassium 2.7, chloride 100, bicarbonate 26, BUN 21, creatinine 1.50, glucose 116. Magnesium is 1.7. Lactic acid is 1.9, total protein 6.4. Other LFTs are normal. Troponin less than 0.03. Urine drug screen is negative. White blood cells 8.5, hemoglobin 9.7, platelets 284,000, MCV is 91. INR is 1.0. UA is positive for infection with 327 white blood cells per high-powered field.   IMAGING:  1.  CT scan of the head noncontrast shows chronic left frontal and parietal or mid cerebral infarction. Chronic  right caudate nucleus infarct. No evidence for acute intracranial abnormality.  2.  Chest x-ray shows stable cardiomegaly some interval improvement in aeration of the right upper lobe, persistent right upper lobe infiltrate.  3.   Renal ultrasound shows mild to moderate bilateral hydronephrosis. No definite evidence of distal obstruction. This is thought to reflect the patient significantly distended bladder. The patient does not describe any sensation of needing to void during examination. The patient attempted unsuccessfully to void during this examination. Foley catheter placement is suggested.  ASSESSMENT AND PLAN: Problem #1.  Sepsis due to urinary tract infection: On presentation, she was hypotensive which is likely the cause of her syncopal event this morning. After 3 liters of normal saline, she is up to 118/70s. Levophed had been ordered after the second unit, but was not started and I will not start that medication at this time as her blood pressure seems to be stable. We will continue with IV normal saline at 125 per hour. She is being admitted to the critical care unit for  close observation after initial presentation with hypotension. Blood cultures and urine cultures are pending.  Problem #2.  Urinary tract infection: We will cover broadly as she does have a history of an extended spectrum beta-lactamase  enterococcus on a prior urinalysis. She has received Rocephin and Zosyn in the Emergency Room as well as 1 dose of vancomycin. At this point, we will start meropenem and stop other antibiotics. Await culture results for further antibiotic decision-making.  Problem #3.  Acute renal failure. Her baseline creatinine is around 1.2. Today, it is up to 1.5. This is likely due to hypotension, hypovolemia, sepsis, as well as to urinary tract outlet obstruction with hydronephrosis. I have placed a Foley catheter to decompress the bladder and kidneys. Hydrating. Recheck creatinine in the morning.   Problem #4.  Hypokalemia. The patient has a long history of hypokalemia on prior presentations. Cannot see any contributing medications, but would review this with pharmacy. We will replace her potassium now intravenous and p.o. and check a magnesium, recheck potassium in a few hours. Problem #5.  Anemia: Hemoglobin 9.7, this is near her baseline. No signs of recent bruising or bleeding. No further investigation at this time.  Problem #6.  Chronic obstructive pulmonary disease: She does have some wheezing and coughing today. We will continue Spiriva. On discharge, will continue Advair Diskus while inpatient as well as nebulizers q. 6 hours as needed. I will not initiate steroids at this time. She did receive 60 mg of Solu-Medrol in the Emergency Room.  Problem #7.  Syncopal event:  I suspect this was due to hypotension. She is fairly groggy, though improving at the time of my examination. I am holding any further sedating medications or medications that could decrease her blood pressure such as her opiates.  Problem #8.  Prophylaxis. Heparin for deep vein thrombosis prophylaxis, Protonix for gastrointestinal prophylaxis.   TIME SPENT ON ADMISSION: 55 minutes.   CODE STATUS: The patient is a full code.   ____________________________ Earleen Newport. Volanda Napoleon, MD cpw:LT D: 02/22/2015 20:06:48 ET T: 02/22/2015 20:45:11 ET JOB#: [id number]  cc: Earleen Newport. Volanda Napoleon, MD, <Dictator> Aldean Jewett MD ELECTRONICALLY SIGNED 03/01/2015 10:19

## 2015-04-10 NOTE — Consult Note (Signed)
Note Type Consult   HPI: This 64 year old Female patient presents to the clinic for lymphoma.  Subjective: Chief Complaint/Diagnosis:   B-cell lymphoma, now with VRE UTI  HPI:   Patient last evaluated and given chemotherapy with Rituxan and Treanda on March 30th and 31st. She was admitted to the hospital with confusion and urine consistent of a UTI. Upon evaluation today, patient appears back to her baseline. She continues to have right-sided weakness from her recent CVA, but otherwise feels well.  Her pain is well-controlled with her current narcotic regimen. She has no other neurologic complaints.  She denies any chest pain or shortness of breath. She has a good appetite and denies weight loss. She denies any nausea, vomiting, constipation, or diarrhea. Patient offers no further specific complaints today.    Review of Systems:  General: denies complaints  Performance Status (ECOG): 2  Musculoskeletal: right-sided weakness, chronic and unchanged.  Pain ?: Yes  Pain- Qualitative: Moderate  Pain- Plan: Narcotic analgesic ordered  Discussed with patient: Prohylactic stimulant laxative  Stool softner  Dietary fiber  Pain Effectiveness: Pain relief obtained  Emotional well-being: Anxiety  Emotional Well-being: Educational material provided  Review of Systems:   As per HPI. Otherwise, a complete review of systems is negative.    Allergies:  No Known Allergies:   Preventive Screening:  Has patient had any of the following test? Mammography   Last Mammography: 04/2013   Smoking History: Smoking History 0.5-1 Packs per day and pt refused smoking cessation. Smoking Cessation Information Given to Patient .  PFSH: Additional Past Medical and Surgical History: Hypertension, osteoarthritis, fibrosed myalgia, schizophrenia, schizoaffective disorder, bipolar disorder, partial hysterectomy, CVA.    Family history: Positive for head and neck cancer.    Social history: Positive tobacco  one pack per day >76yr.  Patient refused smoking cessation counseling.  Often alcohol use as well as marijuana use.   Home Medications: Medication Instructions Last Modified Date/Time  Advair Diskus 250 mcg-50 mcg inhalation powder 1 puff(s) inhaled 2 times a day 04-Apr-16 16:19  gabapentin 600 mg oral tablet 1 tab(s) orally 3 times a day 04-Apr-16 16:19  benztropine 0.5 mg oral tablet 1 tab(s) orally 2 times a day 04-Apr-16 16:19  risperiDONE 1 mg oral tablet 1.5 tab(s) orally once a day (at bedtime) 04-Apr-16 16:19  fentaNYL 25 mcg/hr transdermal film, extended release 1 patch transdermal every 72 hours 04-Apr-16 16:19  oxyCODONE 5 mg oral tablet 1 tab(s) orally every 8 hours, As Needed - for Pain 04-Apr-16 16:19  OXcarbazepine 600 mg oral tablet 1 tab(s) orally 2 times a day 04-Apr-16 16:19  allopurinol 100 mg oral tablet 2 tab(s) orally once a day 04-Apr-16 16:19  esomeprazole 40 mg oral delayed release capsule 1 cap(s) orally once a day (in the morning) 04-Apr-16 16:19  sertraline 100 mg oral tablet 1 tab(s) orally once a day 04-Apr-16 16:19  traZODone 50 mg oral tablet 1 tab(s) orally once a day (at bedtime) 04-Apr-16 16:19  fluPHENAZine decanoate 25 mg/mL injectable solution 1 milliliter(s) injectable every 2 weeks 04-Apr-16 16:19  oxybutynin 5 mg/24 hours oral tablet, extended release 1 tab(s) orally once a day 04-Apr-16 16:19  colchicine 0.6 mg oral tablet 1 tab(s) orally 2 times a day 04-Apr-16 16:19  Spiriva 18 mcg inhalation capsule 1 cap(s) inhaled once a day 04-Apr-16 16:19  ProAir HFA CFC free 90 mcg/inh inhalation aerosol 2 puff(s) inhaled every 4 to 6 hours, As Needed - for Shortness of Breath 04-Apr-16 16:19  Multi-Day Plus Minerals 1 tab(s) orally once a day 04-Apr-16 16:19   Vital Signs:  :: vital signs stable, patient afebrile.   Physical Exam:  General: well developed, well nourished, and in no acute distress  Mental Status: normal affect  Eyes: anicteric sclera   Neck, Thyroid: supraclavicular mass is now minimally palpable.  Respiratory: clear to auscultation bilaterally  Cardiovascular: regular rate and rhythm, no murmur, rub, or gallop  Gastrointestinal: soft, nondistended, nontender, no organomegaly.  normal active bowel sounds  Musculoskeletal: No edema  Skin: No rash or petechiae noted  Neurological: alert, answering all questions appropriately.  right-sided weakness noted.   Laboratory Results: Routine Chem:  05-Apr-16 06:16   Glucose, Serum  124 (65-99 NOTE: New Reference Range  02/15/15)  BUN 18 (6-20 NOTE: New Reference Range  02/15/15)  Creatinine (comp)  1.14 (0.44-1.00 NOTE: New Reference Range  02/15/15)  Sodium, Serum 143 (135-145 NOTE: New Reference Range  02/15/15)  Potassium, Serum  2.9 (3.5-5.1 NOTE: New Reference Range  02/15/15)  Chloride, Serum 110 (101-111 NOTE: New Reference Range  02/15/15)  CO2, Serum 27 (22-32 NOTE: New Reference Range  02/15/15)  Calcium (Total), Serum  8.8 (8.9-10.3 NOTE: New Reference Range  02/15/15)  Anion Gap 7  eGFR (African American)  59  eGFR (Non-African American)  51 (eGFR values <21m/min/1.73 m2 may be an indication of chronic kidney disease (CKD). Calculated eGFR is useful in patients with stable renal function. The eGFR calculation will not be reliable in acutely ill patients when serum creatinine is changing rapidly. It is not useful in patients on dialysis. The eGFR calculation may not be applicable to patients at the low and high extremes of body sizes, pregnant women, and vegetarians.)  Routine Hem:  05-Apr-16 06:16   WBC (CBC)  2.8  RBC (CBC)  3.29  Hemoglobin (CBC)  10.0  Hematocrit (CBC)  30.9  Platelet Count (CBC)  141  MCV 94  MCH 30.4  MCHC 32.3  RDW  18.6  Neutrophil % 75.6  Lymphocyte % 7.8  Monocyte % 11.2  Eosinophil % 4.6  Basophil % 0.8  Neutrophil # 2.1  Lymphocyte #  0.2  Monocyte # 0.3  Eosinophil # 0.1  Basophil # 0.0 (Result(s)  reported on 15 Mar 2015 at 06:38AM.)   Medical Imaging Results:   Review Medical Imaging   Portable Single View 14-Mar-2015 12:41:00: IMPRESSION:  Continued improvement of right upper lobe infiltrate. Stable right  peritracheal adenopathy is noted.      Electronically Signed    By: MInez CatalinaM.D.    On: 03/14/2015 12:51         Verified By: MEverlene Farrier M.D., Head Without Contrast 14-Mar-2015 12:59:00: IMPRESSION:  CT head: Priorinfarcts and small vessel disease, stable. No acute  appearing infarct. No intracranial mass or hemorrhage.    CT cervical spine: Multiple sclerotic lesions in the cervical spine  felt to represent widespread bony metastatic disease from known  lymphoma. No fracture or spondylolisthesis. No cord compression  appreciable on this noncontrast enhanced study. Areas of  osteoarthritic change. Focal carotid artery calcification  bilaterally.      Electronically Signed    By: WLowella GripIII M.D.    On: 03/14/2015 13:17       Verified By: WLeafy Kindle WOODRUFF, M.D., Cervical Spine Without Contrast 14-Mar-2015 12:59:00: IMPRESSION:  CT head: Prior infarctsand small vessel disease, stable. No acute  appearing infarct. No intracranial mass or hemorrhage.    CT cervical spine:  Multiple sclerotic lesions in the cervical spine  felt to represent widespread bony metastatic disease from known  lymphoma. No fracture or spondylolisthesis. No cord compression  appreciable on this noncontrast enhanced study. Areas of  osteoarthritic change. Focal carotid artery calcification  bilaterally.      Electronically Signed    By: Lowella Grip III M.D.    On: 03/14/2015 13:17       Verified By: Leafy Kindle. WOODRUFF, M.D.,  Assessment and Plan: Impression:   Progressive B-cell lymphoma, unclassified. UTI. Plan:   1. Lymphoma: Patient received Treanda plus Rituxan on March 30 and 31. Her most recent imaging revealed progressive disease, but her treatments have been erratic secondary to  noncompliance in hospital admissions. She has been instructed to keep her previously scheduled follow-up appointment in 2 weeks for consideration of her next infusion of chemotherapy.UTI: Continue current antibiotics. Previously, patient had a urine culture positive for VRE. Blood cultures negative to date.Pain: Continue current narcotic regimen. Appreciate died of care input.Hypokalemia: Continue oral iron supplementation and replace IV as needed.Neutropenia: Secondary to chemotherapy. Monitor. Anemia: Chronic, likely secondary to chemotherapy. Monitor. CVA:  Patient states her symptoms are stable. Monitor. consult, call with questions.   Tumor Staging:  Tumor Staging Tumor Staging   Tumor Staging Source Clinical   Stage III   CD-20 (NHL) Status positive   Cancer Status Evidence of disease clinically   Stage/Additional Pathology unclassified.   Treatment Plan (Initial Course):  Intent Curative   Consent Treatment plan was discussed with patient, patient consented to chemotherapy to treat cancer.   Trastuzumab (Heceptin) Not recommended   Not Recommended Contraindicated   Regimen See Orders for Treatment Regimen   Regimen Rituxan 375 mg/m2, cyclophosphamide 600 mg/m2, vincristine 2 mg, prednisone 100 mg daily x5 days. Plan to do 6 cycles then reimage.   Is patient receiving oral chemotherapy? No   Advance Directive:  Forensic scientist Theatre stage manager) no   Advance Directive Information Given yes   Adv Dir Follow-up Patient does not desire follow-up with Chaplain regarding Advance Directives   Electronic Signatures: Delight Hoh (MD)  (Signed 05-Apr-16 18:05)  Authored: Note Type, History of Present Illness, CC/HPI, Review of Systems, ALLERGIES, Preventive Screening, Smoking Cessation, Patient Family Social History, HOME MEDICATIONS, Vital Signs, Physical Exam, Lab Results Review, Rad Results Review, Assessment and Plan, Quality Measures, Advance  Directive   Last Updated: 05-Apr-16 18:05 by Delight Hoh (MD)

## 2015-04-10 NOTE — H&P (Signed)
PATIENT NAME:  Veronica [name], Veronica [name] MR#:  [id number] DATE OF BIRTH:  [birth year]  DATE OF ADMISSION:  03/30/2015  REFERRING PHYSICIAN:  Dr. Clearnce Hasten.   PRIMARY CARE PHYSICIAN:  At [facility], oncologist, Dr. Grayland Ormond.   CHIEF COMPLAINT:  Left-sided weakness.   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  History is from family present at bedside who describes two-day duration of increased frequency of falls with associated worsening left-sided weakness, most pronounced today with the inability to grab something off of a shelf.   Once again with the increased falls, she has noted right-sided ankle swelling with associated pain for one day, described only as "pain."  Intensity is 5 to 6 out of 10, worse with movements, no relieving factors, no radiation.  Presented to the hospital for further workup and evaluation.  MRI performed in the Emergency Department revealed extraosseous tumor burden at C5 with severe spinal stenosis and moderate cord compression.  Attempt to transfer to both [facility] as well as [facility]; however, these hospitals are on diversion.  The case was discussed by ER staff with Dr. Inez Pilgrim of oncology who recommended admission, continuation of steroids, as well as plans for radiation treatments.   REVIEW OF SYSTEMS:    CONSTITUTIONAL:  Denies fevers, chills.  Positive for fatigue, weakness.  EYES:  Denies blurred vision, double vision, eye pain.  EARS, NOSE, AND THROAT:  Denies tinnitus, ear pain, hearing loss.   RESPIRATORY:  Denies cough, shortness of breath.  CARDIOVASCULAR:  Denies chest pain, palpitations, edema.  GASTROINTESTINAL:  Denies nausea, vomiting, diarrhea, abdominal pain.  GENITOURINARY:  Denies dysuria, hematuria. ENDOCRINE:  Denies nocturia or thyroid problems.  HEMATOLOGIC:  Denies easily bruising or bleeding.    SKIN:  Denies rash or lesion.   MUSCULOSKELETAL:  Denies pain in neck, back, shoulders, knees, hips.  Positive for right ankle pain as stated above.  Denies further arthritic symptoms.   NEUROLOGIC:  Positive for weakness and paralysis as described above.  Denies any paresthesias.  PSYCHIATRIC:  Denies anxiety or depressive symptoms.  Otherwise, full review of systems is performed and is negative.   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     SOCIAL HISTORY:  Positive for tobacco use, remote history of alcohol use.  Denies any drug use.   FAMILY HISTORY:  Positive for coronary artery disease.   ALLERGIES:  No known drug allergies.   HOME MEDICATIONS:  Include methylprednisolone 4 mg daily, aspirin 81 mg p.o. daily, oxycodone 5 mg p.o. q. 8 hours as needed for pain, gabapentin 600 mg p.o. 3 times daily, oxcarbazepine 600 mg p.o. b.i.d., sertraline 100 mg p.o. q. daily, trazodone 50 mg p.o. at bedtime, allopurinol 100 mg 2 tablets p.o. q. daily, colchicine 0.6 mg p.o. b.i.d., benztropine 0.5 mg p.o. b.i.d., fluphenazine decanoate 25 mg injection every two weeks, risperidone 1.5 mg p.o. q. daily, Advair 250/50 mcg inhalation b.i.d., ProAir 90 mcg inhalation 2 puffs q. 4 hours as needed for shortness of breath, Spiriva 18 mcg inhalation daily, esomeprazole 40 mg p.o. q. daily.   PHYSICAL EXAMINATION:  VITAL SIGNS:  Temperature 97.8, heart rate 97, respirations 18, blood pressure 142/74,  saturating 95% on room air.  Weight 77.1 kg, BMI 25.9.   GENERAL:  Chronically ill-appearing African American female currently in no acute distress.  HEAD:  Normocephalic, atraumatic.  EYES:  Pupils equal,  round, reactive to light. Extraocular muscles intact.  No scleral icterus. MOUTH:  Dry mucosal membrane.  Dentition intact.  No abscess noted.   EARS, NOSE, AND THROAT: Clear without exudates. No external lesions.  NECK:  Supple.  No thyromegaly.  No nodules.  No JVD.  PULMONARY:  Clear to auscultation bilaterally without wheezes, rales, or rhonchi.  No use of accessory muscles.  Good respiratory effort.   CHEST:  Nontender to palpation.  CARDIOVASCULAR:  S1, S2.  Regular rate and rhythm.  No murmurs, rubs, or gallops.  No edema. Pedal pulse 2+ bilaterally.  GASTROINTESTINAL:  Soft, nontender, nondistended.  No masses. Positive bowel sounds.    No hepatosplenomegaly.  MUSCULOSKELETAL:  Minimal mild swelling of the right ankle.  No edema.  Range of motion limited on the right side secondary to paralysis; limited on the left side, both upper and lower extremities, given new-onset weakness.  NEUROLOGIC:  Cranial nerves II through XII intact.  Strength 1/5 in right upper extremity both proximal and distal flexion and extension, 2/5 in right lower extremity proximal and distal flexion and extension; 3/5 in left upper extremity proximal and distal flexion and extension, as well as 3/5 in left lower extremity including proximal and distal flexion and extension.  SKIN:  No ulceration, lesions, rashes, cyanosis.  Skin warm, dry.  Turgor intact.  PSYCHIATRIC:  Mood and affect are blunt, flattened.  She is awake, alert, oriented x 3.  Insight and judgment appear to be intact.   LABORATORY DATA:  Sodium 140, potassium 3.2, chloride 104, bicarbonate 27, BUN 20, creatinine 1.06, glucose 115.  LFTs within normal limits.  WBC of 4.9, hemoglobin 11.1, platelets of 239,000.  Urinalysis is negative for evidence of infection.  CT head performed which reveals atrophy, however, no acute findings.  Chest x-ray performed which reveals persistent right perihilar infiltrate as well as right upper lobe.  MRI of cervical spine  performed which reveals diffuse osseous metastatic disease with extensive extraosseous tumor in the mid cervical spine, including a large amount of epidural tumor, greatest at C5, with severe spinal stenosis and moderate cord compression.  EKG performed:  Normal sinus rhythm with evidence of LVH.   ASSESSMENT AND PLAN:  A 64 year old [place] American female with a history of progressive B-cell lymphoma presenting with left-sided weakness.  1. Moderate cord compression secondary to lymphoma at C5.  Consult oncology.  Case already discussed with Dr. Inez Pilgrim of oncology services.  Will continue with Decadron and will do this q. 6 hours.  Provide proton pump inhibitor coverage for gastrointestinal prophylactic as well as insulin sliding scale with q. 6 hour Accu-Cheks.  2. Gastroesophageal reflux disease without esophagitis.  Continue proton pump inhibitor therapy.  3. Hypokalemia.  Replace potassium to goal of 4 to 5.  4. Chronic obstructive pulmonary disease, not in acute exacerbation.  Continue with home medications including Advair and Spiriva.  5. Venous thromboembolism prophylaxis.  Heparin subcutaneous.   CODE STATUS:  The patient is full code.  She requested, if required, to "code her twice" and then make her DNR.     TIME SPENT:  Forty-five minutes.    ____________________________ Aaron Mose. Hower, MD dkh:kc D: 03/30/2015 21:14:28 ET T: 03/30/2015 22:15:20 ET JOB#: [id number]  cc: Aaron Mose. Hower, MD, <Dictator> DAVID K  Lavetta Nielsen MD ELECTRONICALLY SIGNED 03/31/2015 20:33

## 2015-04-10 NOTE — Consult Note (Signed)
Reason for Visit: This 64 year old Female patient presents to the clinic for initial evaluation of  Malignant lymphoma .   Referred by Dr. Grayland Ormond.  Diagnosis:  Chief Complaint/Diagnosis   64 year old female with malignant lymphoma B-cell type with cord impingement in the cervical spine.  Imaging Report MRI of the cervical spine reviewed   Referral Report Clinical notes reviewed   Planned Treatment Regimen Hospice home   HPI   Patient is a 64 year old female patient of Dr. Gary Fleet with past medical history significant for schizoaffective disorder schizophrenia bipolar disorder and history of B-cell lymphoma presented to the hospital with weakness confusion and found to have on MRI scan of her cervical spine cord impingement from probable lymphoma. Patient has been on Rituxan and [place] receiving her last cycles at the end of March. She is also being seen by palliative care. I been asked to evaluate her for possible palliative radiation therapy to her cervical spine. Patient has a known history of CVA with right-sided weakness.  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-2015   Cancer: b-cell lymphoma- in chemo therapy   Cholecytitis:    reflux esophagitis:    osteoarthritis,knees-bilateral:    benign hypertension:    COPD:    Panic Attacks:    Anxiety:    Alcohol Use:    Bipolar Disorder:    Schizophrenia:    HTN:    DM Type 2:    htn:    gout:    CHF:    asthma:    Valve Replacement:   Past, Family and Social History:  Past Medical History positive   Cardiovascular congestive heart failure; hypertension; valvular repair/replacement   Respiratory asthma; COPD   Gastrointestinal Cholecystitis, reflux esophagitis   Endocrine diabetes mellitus   Neurological/Psychiatric anxiety; depression; Schizophrenia, bipolar disorder, panic attacks   Past Surgical History Partial hysterectomy   Past Medical  History Comments Gout   Family History positive   Family History Comments Family history of head and neck cancer   Social History positive   Social History Comments Greater than 30-pack-year smoking history significant marijuana and alcohol abuse history   Additional Past Medical and Surgical History Seen alone in her hospital room   Allergies:   No Known Allergies:   Home Meds:  Home Medications: Medication Instructions Status  methylPREDNISolone 4 mg oral tablet tab(s) orally as directed.  Active  amoxicillin-clavulanate 875 mg-125 mg oral tablet 1 tab(s) orally 2 times a day Active  Advair Diskus 250 mcg-50 mcg inhalation powder 1 puff(s) inhaled 2 times a day Active  gabapentin 600 mg oral tablet 1 tab(s) orally 3 times a day Active  benztropine 0.5 mg oral tablet 1 tab(s) orally 2 times a day Active  esomeprazole 40 mg oral delayed release capsule 1 cap(s) orally once a day Active  risperiDONE 1 mg oral tablet 1.5 tab(s) orally once a day Active  aspirin 81 mg oral tablet 1 tab(s) orally once a day Active  OXcarbazepine 600 mg oral tablet 1 tab(s) orally 2 times a day Active  allopurinol 100 mg oral tablet 2 tab(s) orally once a day Active  oxyCODONE 5 mg oral tablet 1 tab(s) orally every 8 hours, As Needed - for Pain Active  sertraline 100 mg oral tablet 1 tab(s) orally once a day Active  traZODone 50 mg oral tablet 1 tab(s) orally once a day (at bedtime) Active  fluPHENAZine decanoate 25 mg/mL injectable solution 1 milliliter(s) injectable every 2  weeks Active  colchicine 0.6 mg oral tablet 1 tab(s) orally 2 times a day Active  Spiriva 18 mcg inhalation capsule 1 cap(s) inhaled once a day Active  ProAir HFA CFC free 90 mcg/inh inhalation aerosol 2 puff(s) inhaled every 4 to 6 hours, As Needed - for Shortness of Breath Active   Review of Systems:  Review of Systems   Patient is unresponsive at this time which makes review of systems impossible  Physical Exam:   General/Skin/HEENT:  Skin normal   Eyes normal   ENMT normal   Head and Neck normal   Additional PE Unresponsive female in NAD. Lungs are clear to A&P Kartik examination shows regular rate and rhythm. Patient is not cooperative for neurologic examination. Abdomen is benign.   Breasts/Resp/CV/GI/GU:  Respiratory and Thorax normal   Cardiovascular normal   Gastrointestinal normal   Genitourinary normal   MS/Neuro/Psych/Lymph:  Lymphatics normal   Other Results:  Radiology Results: LabUnknown:    20-Apr-16 17:36, MRI Cervical Spine [facility]  PACS Image   MRI:  MRI Cervical Spine [facility]   REASON FOR EXAM:    upper and lower ext weakness, met to c spine  COMMENTS:       PROCEDURE: MR  - MR CERVICAL SPINE WO/W  - Mar 30 2015  5:36PM     CLINICAL DATA:  Neck pain and bilateral upper extremity weakness.  Current history of lymphoma.    EXAM:  MRI CERVICAL SPINE WITHOUT AND WITH CONTRAST    TECHNIQUE:  Multiplanar and multiecho pulse sequences of the cervical spine, to  include the craniocervical junction and cervicothoracic junction,  were obtained according to standard protocol without and with  intravenous contrast.    CONTRAST:  15 mL MultiHance    COMPARISON:  Cervical spine CT 03/14/2015    FINDINGS:  There is straightening of the normal cervical lordosis with at most  trace retrolisthesis of C3 on C4 and trace anterolisthesis of C4 on  C5. Vertebral bone marrow signal is diffusely heterogeneous  consistent with known osseous metastatic disease. There is complete  replacement of the normal marrow signal in the C5 vertebral body  with associated STIR hyperintensity and mild enhancement. There is  extraosseous tumor extension at C5 anteriorly into the prevertebral  soft tissues as well as laterally, with extensive paravertebral  tumor present on the right extending from the C3-4 disc space level  to C6.    There is extensive epidural tumor extending from C4 to  C6, greatest  on the right. Epidural tumor results in spinal stenosis, most severe  at the C5 vertebral body level where there is moderate cord  compression with the spinal cord narrowed to 5 mm AP dimension.  There is tumor involvement of the posterior elements at multiple  levels, most pronounced at C5. Osseous involvement is partially  visualized in the upper thoracic spine.    No cervical spinal cord signal abnormality is identified. Mild  multilevel disc degeneration is noted with mild disc bulging. There  is moderate left neural foraminal stenosis at C3-4. Tumor involves  the right greater than left neural foramina at C5-6 and at least the  right neural foramen at C4-5. T2 hyperintensity in the pons is  nonspecific but may reflect chronic small vessel ischemia. There is  edema/ a small effusion in the prevertebral soft tissues from C3-C6.  No compression fracture is seen.     IMPRESSION:  Diffuse osseous metastatic disease with extensive extraosseous tumor  in the mid  cervical spine including a large amount of epidural  tumor, greatest at C5 where there is severe spinal stenosis and  moderate cord compression.    Critical Value/emergent results were called by telephone at the time  of interpretation on 03/30/2015 at 6:17 pm to Dr. Larae Grooms ,  who verbally acknowledged these results.  Electronically Signed    By: Logan Bores    On: 03/30/2015 18:17         Verified By: Ferol Luz, M.D.,   Relevent Results:   Relevant Scans and Labs MRI scans reviewed   Assessment and Plan: Impression:   64 year old female with a advanced B-cell lymphoma with cord compression of the cervical spine in unresponsive patient to be transferred to hospice home Plan:   This time I discussed the case with palliative care team and patient will be transferred to the hospice home. They're contacted the patient's son for authorization. At this time do not believe palliative radiation  therapy is indicated and would keep patient as comfortable as possible under hospice guidelines. We'll be happy to reevaluate the patient should family decide pride of radiation therapy is desired. Case was also discussed with medical oncolog  Who is in agreement to take Septra phenomena participate in this unfortunate patient's care.  Electronic Signatures: Muaaz Brau, Roda Shutters (MD)  (Signed 22-Apr-16 10:28)  Authored: HPI, Diagnosis, Past Hx, PFSH, Allergies, Home Meds, ROS, Physical Exam, Other Results, Relevent Results, Encounter Assessment and Plan   Last Updated: 22-Apr-16 10:28 by Armstead Peaks (MD)

## 2015-04-10 NOTE — Discharge Summary (Signed)
PATIENT NAME:  Veronica [name], Veronica [name] MR#:  [id number] DATE OF BIRTH:  1951/10/21  DATE OF ADMISSION:  02/22/2015 DATE OF DISCHARGE:  02/23/2015  ADMITTING PHYSICIAN: Theodoro Grist, MD  DISCHARGING PHYSICIAN: Gladstone Lighter, MD  PRIMARY ONCOLOGIST: Kathlene November. Grayland Ormond, [place]:  None.  DISCHARGE DIAGNOSES: 1.  Sepsis from urinary tract infection.  2.  Progressive B-cell lymphoma currently undergoing chemotherapy.  3.  History of stroke with residual right-sided weakness.  4.  Hypertension.  5.  Diabetes.  6.  Bipolar disorder.  7.  Schizophrenia.  8.  Gout.  9.  Chronic obstructive pulmonary disease.  10.  Diastolic congestive heart failure.  11.  History of cardiac valve surgery.  12.  Osteoarthritis.  13.  Gastroesophageal reflex disease.  14.  Congestive heart failure.  DISCHARGE HOME MEDICATIONS:  1.  Sertraline 100 mg p.o. daily.  2.  Spiriva 18 mcg HandiHaler daily.  3.  Nexium 40 mg p.o. daily.  4.  ProAir inhaler 2 puffs 4 times a day as needed for shortness of breath.  5.  Advair 250/50, 1 puff b.i.d.  6.  Gabapentin 600 mg p.o. 3 times a day.  7.  Benztropine 0.5 mg p.o. b.i.d.  8.  Risperidone 1 mg 1-1/2 tablet p.o. at bedtime.  9.  Fentanyl 25 mcg patch transdermal q. 72 hours.  10.  Oxycodone 5 mg p.o. q. 8 hours p.r.n. for pain.  11.  Oxcarbazepine 600 mg p.o. b.i.d.  12.  Fluphenazine 25 mg injectable every 2 weeks.  13.  Oxybutynin 5 mg p.o. daily.  14.  Allopurinol 100 mg 2 tablets daily.  15.  Multivitamin 1 tablet p.o. daily.  16.  Levaquin 250 mg p.o. daily for 6 more days.   DISCHARGE DIET: Regular diet.   DISCHARGE ACTIVITY: As tolerated.   FOLLOWUP INSTRUCTIONS: 1.  Follow up with Dr. Grayland Ormond in 1-2 days for chemotherapy.  2.  PCP followup in 2 weeks.  3.  Urology followup in 2 weeks for chronic urinary retention and bilateral hydronephrosis.  4.  Outpatient palliative care followup as prior scheduled.  LABORATORY  DATA AND IMAGING STUDIES: Prior to discharge, WBC 6.3, hemoglobin 9.3, hematocrit 29.6, platelet count 242,000. Sodium 141, potassium 3.9, chloride 110, bicarbonate 27, BUN 16, creatinine 1.1, glucose 92, and calcium of 8.8. ALT 10, AST 14, alkaline phosphatase 84, total bilirubin 0.2, albumin of 3.1. Ultrasound kidneys bilaterally showing mild to moderate bilateral hydronephrosis. No definite evidence of obstruction. Distally bladder is distended and suggesting minimal debris. Probably patient has chronic urinary retention. There is an 8 mm nonobstructing stone at lower pole of right kidney. CT of the head showing chronic left frontoparietal and middle cerebral infarction, chronic right caudate nucleus infarct. No evidence of acute abnormality. Urinalysis with 3+ leukocyte esterase, multiple WBCs, few bacteria seen. Urinalysis negative for any infection. Blood cultures are negative so far. Chest x-ray on admission showing stable cardiomegaly, interval aeration of right upper lobe, persistent right upper lobe infiltrate present.   BRIEF HOSPITAL COURSE: Ms. Hobin is a 64 year old African American female with past medical history significant for stroke, right-sided weakness, B-cell lymphoma, undergoing chemotherapy with Dr. Grayland Ormond, hypertension, diabetes, bipolar disorder, schizophrenia, who presents to the hospital secondary to syncopal episode and hypotension.  1.  Syncope and hypotension, partly dehydration and partly infection. Had VRE in the urine in February 2016; however, her UA did not show any bacteria at this time.  Chest x-ray revealed  chronic right upper lobe  infiltrate actually improving from before. She was given IV fluids, and was started on pressors, blood pressure improved. According to son, the patient has been chronically running low normal blood pressure probably because of multiple pain medications she is on from her cancer. She was advised to stay hydrated, avoid any blood pressure  medications at this time and started on Levaquin for UTI and her lung infiltrate. So, she will follow up with her PCP as an outpatient. The patient was very adamant on going home today, since she is stable, otherwise she is being discharged. Family updated.  2.  Acute renal failure on presentation and chronic bilateral mild hydronephrosis. She does have some bladder wall thickening, needed a Foley catheter for distended bladder on admission; however, after Foley was discontinued the patient was able to void.  She probably has some chronic retention. Was advised to follow with urology as an outpatient. With fluids, her renal failure has improved.  3.  Bipolar and schizophrenia with some impulsive behavior. All her psychiatric medications are being continued. 4.  B-cell lymphoma, progressive, based on last PET scan in November; however, started on chemotherapy, finished 2 cycles. Follow up with Dr. Grayland Ormond for 3rd cycle. The patient is due for chemotherapy today, but will be discharged and following up with Dr. Grayland Ormond as an outpatient.   Her course has been otherwise uneventful in the hospital.   DISCHARGE CONDITION: Stable.   DISCHARGE DISPOSITION: Home.   TIME SPENT ON DISCHARGE: 40 minutes.   ____________________________ Gladstone Lighter, MD rk:LT D: 02/23/2015 16:24:00 ET T: 02/23/2015 20:06:09 ET JOB#: [id number]  cc: Gladstone Lighter, MD, <Dictator> Kathlene November. Grayland Ormond, MD Gladstone Lighter MD ELECTRONICALLY SIGNED 02/24/2015 18:37

## 2015-04-10 NOTE — Consult Note (Signed)
Note Type Consult   HPI: This 64 year old Female patient presents to the clinic for lymphoma.  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 HPI:   Patient admitted to the hospital overnight with confusion and weakness and found to have possible cord compression. She last received chemotherapy with Rituxan and Treanda on March 30th and 31st. Recently patient was placed on hospice care due to declining performance status. Currently, patient is alert and appears to understand, but communication is difficult. She continues to have right-sided weakness.  Review of systems is difficult to obtain.   Review of Systems:  General: weakness  fatigue  Performance Status (ECOG): 2  4  Musculoskeletal: right-sided weakness, chronic and unchanged.  Review of Systems:   Difficult to obtain secondary to underlying confusion.    Allergies:  No Known Allergies:   Preventive Screening:  Has patient had any of the following test? Mammography   Last Mammography: 04/2013   Smoking History: Smoking History 0.5-1 Packs per day and pt refused smoking cessation. Smoking Cessation Information Given to Patient .  PFSH: Additional Past Medical and Surgical History: Hypertension, osteoarthritis, fibrosed myalgia, schizophrenia, schizoaffective disorder, bipolar disorder, partial hysterectomy, CVA.    Family history: Positive for head and neck cancer.    Social history: Positive tobacco one pack per day >25yrs.  Patient refused smoking cessation counseling.  Often alcohol use as well as marijuana use.   Home Medications: Medication Instructions Last Modified Date/Time  methylPREDNISolone 4 mg oral tablet tab(s) orally as directed.  20-Apr-16 20:46  amoxicillin-clavulanate 875 mg-125 mg oral tablet 1 tab(s) orally 2 times a day 20-Apr-16 20:46  Advair Diskus 250 mcg-50 mcg inhalation powder 1 puff(s) inhaled 2 times a day  20-Apr-16 20:46  gabapentin 600 mg oral tablet 1 tab(s) orally 3 times a day 20-Apr-16 20:46  benztropine 0.5 mg oral tablet 1 tab(s) orally 2 times a day 20-Apr-16 20:46  OXcarbazepine 600 mg oral tablet 1 tab(s) orally 2 times a day 20-Apr-16 20:46  allopurinol 100 mg oral tablet 2 tab(s) orally once a day 20-Apr-16 20:46  esomeprazole 40 mg oral delayed release capsule 1 cap(s) orally once a day 20-Apr-16 20:46  risperiDONE 1 mg oral tablet 1.5 tab(s) orally once a day 20-Apr-16 20:46  aspirin 81 mg oral tablet 1 tab(s) orally once a day 20-Apr-16 20:46  oxyCODONE 5 mg oral tablet 1 tab(s) orally every 8 hours, As Needed - for Pain 20-Apr-16 20:46  sertraline 100 mg oral tablet 1 tab(s) orally once a day 20-Apr-16 20:46  traZODone 50 mg oral tablet 1 tab(s) orally once a day (at bedtime) 20-Apr-16 20:46  fluPHENAZine decanoate 25 mg/mL injectable solution 1 milliliter(s) injectable every 2 weeks 20-Apr-16 20:46  colchicine 0.6 mg oral tablet 1 tab(s) orally 2 times a day 20-Apr-16 20:46  Spiriva 18 mcg inhalation capsule 1 cap(s) inhaled once a day 20-Apr-16 20:46  ProAir HFA CFC free 90 mcg/inh inhalation aerosol 2 puff(s) inhaled every 4 to 6 hours, As Needed - for Shortness of Breath 20-Apr-16 20:46   Vital Signs:  :: Vital signs stable, patient afebrile.   Physical Exam:  General: well developed, well nourished, and in no acute distress  Mental Status: Confused  Eyes: anicteric sclera  Respiratory: clear to auscultation bilaterally  Cardiovascular: regular rate and rhythm, no murmur, rub, or gallop  Gastrointestinal: soft, nondistended, nontender, no organomegaly.  normal active bowel sounds  Musculoskeletal: No edema  Skin: No rash  or petechiae noted  Neurological: Confused, unclear comprehension.  right-sided weakness noted.   Laboratory Results: Routine Chem:  21-Apr-16 07:20   Glucose, Serum  131 (65-99 NOTE: New Reference Range  02/15/15)  BUN  23 (6-20 NOTE: New  Reference Range  02/15/15)  Creatinine (comp)  1.07 (0.44-1.00 NOTE: New Reference Range  02/15/15)  Sodium, Serum  147 (135-145 NOTE: New Reference Range  02/15/15)  Potassium, Serum  3.0 (3.5-5.1 NOTE: New Reference Range  02/15/15)  Chloride, Serum 105 (101-111 NOTE: New Reference Range  02/15/15)  CO2, Serum 27 (22-32 NOTE: New Reference Range  02/15/15)  Calcium (Total), Serum  10.4 (8.9-10.3 NOTE: New Reference Range  02/15/15)  Anion Gap 15  eGFR (African American) >60  eGFR (Non-African American)  55 (eGFR values <47mL/min/1.73 m2 may be an indication of chronic kidney disease (CKD). Calculated eGFR is useful in patients with stable renal function. The eGFR calculation will not be reliable in acutely ill patients when serum creatinine is changing rapidly. It is not useful in patients on dialysis. The eGFR calculation may not be applicable to patients at the low and high extremes of body sizes, pregnant women, and vegetarians.)  Routine Hem:  21-Apr-16 07:20   WBC (CBC) 4.3  RBC (CBC) 4.09  Hemoglobin (CBC) 12.6  Hematocrit (CBC) 37.7  Platelet Count (CBC) 275  MCV 92  MCH 30.9  MCHC 33.5  RDW  17.0  Neutrophil % 73.6  Lymphocyte % 16.2  Monocyte % 9.2  Eosinophil % 0.2  Basophil % 0.8  Neutrophil # 3.1  Lymphocyte #  0.7  Monocyte # 0.4  Eosinophil # 0.0  Basophil # 0.0 (Result(s) reported on 31 Mar 2015 at 07:53AM.)   Medical Imaging Results:   Review Medical Imaging   Head Without Contrast 30-Mar-2015 08:33:00: IMPRESSION:  Atrophy with small vessel chronic ischemic changes of deep cerebral  white matter.    Old LEFT hemispheric watershed infarct unchanged.  No acute intracranial abnormalities.      Electronically Signed    By: Lavonia Dana M.D.    On: 03/30/2015 08:58         Verified By: Burnetta Sabin, M.D., Portable Single View 30-Mar-2015 12:34:00: IMPRESSION:  Enlargement of cardiac silhouette.    Persistent RIGHT perihilar infiltrate particularly  into RIGHT upper  lobe.      Electronically Signed    YH:CWCB  Boles M.D.    On: 03/30/2015 12:47         Verified By: Burnetta Sabin, M.D., Right Complete 30-Mar-2015 13:07:00: IMPRESSION:  1. No acute fracture or dislocation identified about the right hand.  2. Scapholunate interval widening suggesting ligamentous injury that  could be chronic.      Electronically Signed    By: Genevie Ann M.D.    On: 03/30/2015 13:26       Verified By: Gwenyth Bender. HALL, M.D., Spine Complete 30-Mar-2015 13:07:00: IMPRESSION:  1. No acute fracture or listhesis identified in the cervical spine.  Ligamentous injury is not excluded.  2. Metastatic disease to bone re- identified at C2 and C3.      Electronically Signed    By: Genevie Ann M.D.    On: 03/30/2015 13:29         Verified By: Gwenyth Bender. HALL, M.D., 30-Mar-2015 14:57:00: (no IMPRESSION found) Color Flow Doppler Low Extrem Bilat (Legs) 30-Mar-2015 14:57:00: IMPRESSION:  No evidence of DVT within either lower extremity.      Electronically Signed    By:  Sandi Mariscal M.D.    On: 03/30/2015 15:03         Verified By: Aileen Fass, M.D., Cervical Spine [facility] 30-Mar-2015 17:36:00: IMPRESSION:  Diffuse osseous metastatic disease with extensive extraosseous tumor  in the mid cervical spine including a large amount of epidural  tumor, greatest at C5 where there is severe spinal stenosis and  moderate cord compression.    Critical Value/emergent results were called by telephone at the time  of interpretation on 03/30/2015 at 6:17 pm to Dr. Larae Grooms ,  who verbally acknowledged these results.  Electronically Signed    By: Logan Bores    On: 03/30/2015 18:17         Verified By: Ferol Luz, M.D., Right Complete 30-Mar-2015 20:49:00: IMPRESSION:  Subacute fracture lateral malleolus, similar to prior.      Electronically Signed    By: Carlos Levering M.D.    On: 03/30/2015 21:19       Verified By: Tommi Rumps, M.D.,  Assessment and Plan: Impression:   B-cell lymphoma, now  with weakness, confusion, and possible cervical cord compression. Plan:   1. Lymphoma: Patient received Treanda plus Rituxan on March 30 and 31. No further treatments are planned. Patient was recently placed on hospice.  Appreciate palliative care input.Cord compression: MRI results reviewed independently. Case has been discussed with radiation oncology. Continue IV Decadron as ordered. No need to transfer patient at this time.Pain: Continue current narcotic regimen. Hypokalemia: Replace IV as needed.Right sided weakness: Secondary to CVA several months ago. Monitor. consult, I will be in [facility] on Friday. Please call the on-call physician, Dr. Ma Hillock, with any questions.   Tumor Staging:  Tumor Staging Tumor Staging   Tumor Staging Source Clinical   Stage III   CD-20 (NHL) Status positive   Cancer Status Evidence of disease clinically   Stage/Additional Pathology unclassified.   Treatment Plan (Initial Course):  Intent Curative   Consent Treatment plan was discussed with patient, patient consented to chemotherapy to treat cancer.   Trastuzumab (Heceptin) Not recommended   Not Recommended Contraindicated   Regimen See Orders for Treatment Regimen   Regimen Rituxan 375 mg/m2, cyclophosphamide 600 mg/m2, vincristine 2 mg, prednisone 100 mg daily x5 days. Plan to do 6 cycles then reimage.   Is patient receiving oral chemotherapy? No   Advance Directive:  Forensic scientist Theatre stage manager) no   Advance Directive Information Given yes   Adv Dir Follow-up Patient does not desire follow-up with Chaplain regarding Advance Directives   Electronic Signatures: Delight Hoh (MD)  (Signed 21-Apr-16 18:06)  Authored: Note Type, History of Present Illness, CC/HPI, Review of Systems, ALLERGIES, Preventive Screening, Smoking Cessation, Patient Family Social History, HOME MEDICATIONS, Vital Signs, Physical Exam, Lab Results Review, Rad Results Review, Assessment and  Plan, Quality Measures, Advance Directive   Last Updated: 21-Apr-16 18:06 by Delight Hoh (MD)

## 2015-04-10 NOTE — Consult Note (Signed)
   Comments   Case discussed with Dr Benjie Karvonen. Pt well known to [place] from her previous hospitalization and has also been seen in our outpatient clinic. Pt has been offered hospice services in the past but pt and family could not agree on this option. She would be appropriate for hospice but would need to stop chemotx, which has not been something that patient has consistently wanted. If patient has changed her mind on this and does want to stop chemotx then I would recommend hospice referral. Will hold on consult for now.   Electronic Signatures: Borders, Kirt Boys (NP)  (Signed 05-Apr-16 14:47)  Authored: Palliative Care   Last Updated: 05-Apr-16 14:47 by Irean Hong (NP)

## 2015-04-10 NOTE — Discharge Summary (Signed)
PATIENT NAME:  Veronica [name], Veronica [name] MR#:  [id number] DATE OF BIRTH:  [birth year]  DATE OF ADMISSION:  03/30/2015 DATE OF DISCHARGE:  04/02/2015  ADMITTING PHYSICIAN:  Valentino Nose, MD   DISCHARGING PHYSICIAN: Gladstone Lighter, MD   PRIMARY CARE PHYSICIAN: Nonlocal.   PRIMARY ONCOLOGIST: Delight Hoh, MD   [place]:  1. Oncology consultation with Delight Hoh, MD 2. Radiation oncology consultation by Noreene Filbert, MD 3. Palliative care consultation by Efraim Kaufmann, MD.    DISCHARGE DIAGNOSES: 1. 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  2. Recent cerebrovascular accident with right-sided weakness.  3. 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  4. Schizophrenia.  5. Bipolar disorder.  6. Hypertension.  7. Chronic anemia.  8. Tobacco use disorder.  9. Gout.  10. History of alcohol abuse.  11. Osteoarthritis.    DISCHARGE MEDICATIONS: 1. Roxanol 20 mg/mL strength 0.25 to 0.5 mL every hour as needed for comfort, pain, dyspnea.  2. Tylenol suppository 650 mg rectal every 4 hours p.r.n. for mild pain or fever.  3. Ativan 2 mg/mL oral concentrate 0.5 mL every 2 hours as needed for anxiety, nervousness, or agitation.    DISCHARGE DIET: As tolerated. Regular diet by mouth.   LABORATORY/IMAGING PRIOR TO DISCHARGE:  1. Sodium 146, potassium 3.2, chloride 105, bicarbonate 27, BUN 49, creatinine 1.7, glucose 149, calcium of 10.0  2. WBC is 4.3, hemoglobin 12.6, hematocrit 37.7, platelet count is 275,000. 3. MRI of the C-spine done on admission for upper and lower extremity weakness showing osseous metastatic disease with extensive extraosseous tumor with large amount of epidural tumor especially greatest at C5 resulting in severe spinal stenosis and moderate cord compression.   BRIEF HOSPITAL COURSE: Veronica [name] is a 64 year old African American female with past medical history significant for CVA, progressive B-cell lymphoma despite 2nd line  chemotherapy, hypertension, who presented with left-sided weakness. MRI of the C-spine revealed that the patient has significant extraosseous tumor burden at level C5 with significant spinal stenosis and moderate cord compression. Attempt to transfer to both [facility] and [facility] was  not successful from the Emergency Room, so oncologist on call, Dr. Inez Pilgrim, recommended admission here with IV steroids and radiation possibly.   The patient was clinically getting worse. Dr. Baruch Gouty from  radiation oncology was consulted on this patient who recommend the prognosis seemed to be extremely poor and palliative radiation  is not going to help her. After discussing with the oncologist, radiation oncologist, and palliative care physician, patient, the patient's family decided to keep her comfort care. So the patient is being transferred to hospice home today.   ADDITIONAL TIME SPENT: 40 minutes.   CODE STATUS: DO NOT RESUSCITATE.    ____________________________ Gladstone Lighter, MD rk:tr D: 04/02/2015 11:36:53 ET T: 04/02/2015 17:18:45 ET JOB#: [id number]  cc: Gladstone Lighter, MD, <Dictator> Gladstone Lighter MD ELECTRONICALLY SIGNED 04/04/2015 15:14

## 2015-04-10 NOTE — Discharge Summary (Signed)
PATIENT NAME:  Veronica [name], Veronica [name] MR#:  [id number] DATE OF BIRTH:  27-Apr-1951  DATE OF ADMISSION:  03/14/2015 DATE OF DISCHARGE:  03/16/2015  ADMISSION DIAGNOSES:  1. Sepsis.  2. Urinary tract infection.  DISCHARGE DIAGNOSES: 1. Recurrent sepsis due to urinary tract infection.  2. Urinary tract infection without hematuria.  3. Acute on chronic renal failure.  4. Chronic anemia.  5. Progressive B-cell lymphoma with metastatic disease.   CONSULTATIONS: Delight Hoh, MD   PHYSICAL EXAMINATION AT DISCHARGE: VITAL SIGNS: Temperature 98.6, pulse 93, respirations 18, blood pressure 107/76, saturation 94% on room air.  GENERAL: The patient is alert and oriented.  CARDIOVASCULAR: Regular rate and rhythm. No murmurs, gallops, or rubs. PMI was not displaced.  LUNGS: Clear to auscultation without crackles, rales, rhonchi, or wheezing. Normal  percussion.  ABDOMEN: Bowel sounds positive. Nontender, nondistended. No hepatosplenomegaly.   HOSPITAL COURSE: This is a 64 year old female who presented on 03/14/2015 with symptoms of a urinary tract infection. For further details, please refer to the H and P.  1. Sepsis. The patient was admitted with diagnosis of sepsis and this was due to a urinary tract infection. 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 However, on this admission, her urine culture was negative to date as well as her blood cultures. She was on Rocephin. She was monitored though she has no symptoms, so we will continue Keflex at discharge.  2. Hypokalemia.  Repleted prior to  discharge.  3. Her magnesium was also checked and was repleted as well.  4. Urinary tract infection. The patient was on Rocephin, changed to p.o. Keflex. Urine culture was negative.  5. History of B-cell lymphoma with metastases to bones. The patient is seen by Dr. Grayland Ormond as an outpatient.  6. Hypotension, resolved.   DISCHARGE MEDICATIONS: 1. Advair Diskus 250/50 b.i.d.  2. Gabapentin 600 mg t.i.d.  3. Benztropine  0.5 b.i.d.  4. Risperdal 1 mg 1-1/2 tablets at bedtime.  5. Fentanyl 25 mcg q. 72 hours.  6. Oxycodone 5 mg q. 8 hours p.r.n.  7. Oxcarbazepine 600 mg b.i.d.  8. Allopurinol 100 mg 2 tablets daily.  9. Nexium 40 mg daily.  10. Zoloft 100 mg daily.  11. Trazodone 50 mg at bedtime.  12. Fluphenazine 1 mL every 2 weeks, that is 25 mg/mL.  13. Oxybutynin 1 tablet daily.  14. Colchicine 0.6 mg b.i.d.  15. Spiriva 18 mcg daily.  16. ProAir 2 puffs q. 4-6 hours p.r.n.  17. Multivitamin 1 tablet daily.  18. Keflex 500 mg t.i.d. x7 days.   DISCHARGE INSTRUCTIONS: Discharge home with home health, physical therapy, and nurse for assessment and gait.   DISCHARGE DIET: Low sodium.   DISCHARGE ACTIVITY: As tolerated.   DISCHARGE FOLLOWUP:  The patient will be scheduled for urology follow-up for recurrent UTIs and will need to keep an appointment with her primary care physician in 1 week.   Plan of care was discussed with the patient's son.   TIME SPENT: Approximately 40 minutes.    ____________________________ Donell Beers. Benjie Karvonen, MD spm:tr D: 03/16/2015 11:09:31 ET T: 03/16/2015 11:50:53 ET JOB#: [id number]  cc: Blayke Pinera P. Benjie Karvonen, MD, <Dictator> Casimer Bilis Chaquetta Schlottman MD ELECTRONICALLY SIGNED 03/16/2015 12:37

## 2015-04-10 DEATH — deceased

## 2015-04-11 ENCOUNTER — Ambulatory Visit: Payer: Medicare Other | Admitting: Medical

## 2015-12-23 ENCOUNTER — Telehealth: Payer: Self-pay | Admitting: Behavioral Health

## 2015-12-23 NOTE — Telephone Encounter (Signed)
Assessed the following care gaps: Pap (past hx of hysterectomy) Mammogram Colonoscopy Flu  Attempted to reach patient, but the mobile/home number provided is a non-working number. The emergency contact number is invalid as well.
